# Patient Record
Sex: Male | Born: 1940
Health system: Southern US, Community
[De-identification: ages and names within clinical notes are randomized; demographics above are authoritative.]

## PROBLEM LIST (undated history)

## (undated) DIAGNOSIS — K219 Gastro-esophageal reflux disease without esophagitis: Secondary | ICD-10-CM

## (undated) DIAGNOSIS — I10 Essential (primary) hypertension: Secondary | ICD-10-CM

## (undated) DIAGNOSIS — M1990 Unspecified osteoarthritis, unspecified site: Secondary | ICD-10-CM

## (undated) DIAGNOSIS — C61 Malignant neoplasm of prostate: Secondary | ICD-10-CM

## (undated) DIAGNOSIS — E785 Hyperlipidemia, unspecified: Secondary | ICD-10-CM

## (undated) DIAGNOSIS — G629 Polyneuropathy, unspecified: Secondary | ICD-10-CM

## (undated) HISTORY — PX: POLYPECTOMY: SHX149

## (undated) HISTORY — DX: Malignant neoplasm of prostate: C61

## (undated) HISTORY — DX: Unspecified osteoarthritis, unspecified site: M19.90

## (undated) HISTORY — PX: COLONOSCOPY: SHX174

## (undated) HISTORY — DX: Polyneuropathy, unspecified: G62.9

## (undated) HISTORY — DX: Essential (primary) hypertension: I10

## (undated) HISTORY — DX: Hyperlipidemia, unspecified: E78.5

## (undated) HISTORY — DX: Gastro-esophageal reflux disease without esophagitis: K21.9

---

## 1998-11-01 DIAGNOSIS — K760 Fatty (change of) liver, not elsewhere classified: Secondary | ICD-10-CM | POA: Insufficient documentation

## 1999-04-14 ENCOUNTER — Inpatient Hospital Stay (HOSPITAL_COMMUNITY): Admission: EM | Admit: 1999-04-14 | Discharge: 1999-05-01 | Payer: Self-pay | Admitting: Emergency Medicine

## 1999-04-14 ENCOUNTER — Encounter: Payer: Self-pay | Admitting: Infectious Diseases

## 1999-04-15 ENCOUNTER — Encounter: Payer: Self-pay | Admitting: Internal Medicine

## 1999-04-15 ENCOUNTER — Encounter: Payer: Self-pay | Admitting: Infectious Diseases

## 1999-04-16 ENCOUNTER — Encounter: Payer: Self-pay | Admitting: Critical Care Medicine

## 1999-04-16 ENCOUNTER — Encounter: Payer: Self-pay | Admitting: Pulmonary Disease

## 1999-04-17 ENCOUNTER — Encounter: Payer: Self-pay | Admitting: Pulmonary Disease

## 1999-04-17 ENCOUNTER — Encounter: Payer: Self-pay | Admitting: Critical Care Medicine

## 1999-04-18 ENCOUNTER — Encounter: Payer: Self-pay | Admitting: Pulmonary Disease

## 1999-04-20 ENCOUNTER — Encounter: Payer: Self-pay | Admitting: Pulmonary Disease

## 1999-04-21 ENCOUNTER — Encounter (INDEPENDENT_AMBULATORY_CARE_PROVIDER_SITE_OTHER): Payer: Self-pay

## 1999-04-23 ENCOUNTER — Encounter: Payer: Self-pay | Admitting: Infectious Diseases

## 1999-04-25 ENCOUNTER — Encounter: Payer: Self-pay | Admitting: Critical Care Medicine

## 1999-04-26 ENCOUNTER — Encounter: Payer: Self-pay | Admitting: Critical Care Medicine

## 1999-04-28 ENCOUNTER — Encounter: Payer: Self-pay | Admitting: Pulmonary Disease

## 2005-03-11 ENCOUNTER — Encounter: Admission: RE | Admit: 2005-03-11 | Discharge: 2005-03-11 | Payer: Self-pay | Admitting: Internal Medicine

## 2005-06-22 ENCOUNTER — Ambulatory Visit (HOSPITAL_COMMUNITY): Admission: RE | Admit: 2005-06-22 | Discharge: 2005-06-22 | Payer: Self-pay | Admitting: *Deleted

## 2005-06-22 ENCOUNTER — Encounter (INDEPENDENT_AMBULATORY_CARE_PROVIDER_SITE_OTHER): Payer: Self-pay | Admitting: *Deleted

## 2006-06-14 ENCOUNTER — Encounter: Admission: RE | Admit: 2006-06-14 | Discharge: 2006-06-14 | Payer: Self-pay | Admitting: Internal Medicine

## 2010-02-05 ENCOUNTER — Encounter: Admission: RE | Admit: 2010-02-05 | Discharge: 2010-02-05 | Payer: Self-pay | Admitting: Internal Medicine

## 2010-11-01 DIAGNOSIS — M069 Rheumatoid arthritis, unspecified: Secondary | ICD-10-CM | POA: Insufficient documentation

## 2011-03-19 NOTE — Op Note (Signed)
NAMEADVAY, VOLANTE               ACCOUNT NO.:  [id number]   MEDICAL RECORD NO.:  [id number]          PATIENT TYPE:  AMB   LOCATION:  ENDO                         FACILITY:  [facility]   PHYSICIAN:  Georgiana Spinner, M.D.    DATE OF BIRTH:  1940-11-02   DATE OF PROCEDURE:  06/22/2005  DATE OF DISCHARGE:                                 OPERATIVE REPORT   PROCEDURE:  Colonoscopy.   INDICATIONS:  Colon polyps.   ANESTHESIA:  Demerol 20 mg, Versed 2 mg.   PROCEDURE:  With the patient mildly sedated in the left lateral decubitus  position, the Olympus videoscopic colonoscope was inserted in the rectum  after a rectal exam was performed and was unremarkable.  Subsequently the  colonoscope was advanced under direct vision to the cecum, identified by  ileocecal valve and appendiceal orifice, both which photographed.  In the  cecum was a small polyp, which was ablated with the hot biopsy forceps.  __________  a small polyp with snare cautery technique , both with the same  setting of 20/200 blended current, placed in the same bottle labeled,  cecum.  From this point colonoscope was then slowly withdrawn taking  circumferential views of colonic mucosa, stopping in the rectum, which  appeared normal on direct and showed hemorrhoids on retroflexed view.  The  endoscope straightened and withdrawn.  The patient's vital signs and pulse  oximetry were stable.  The patient tolerated the procedure well without  apparent complications.   FINDINGS:  Polyps of the cecum, internal hemorrhoids, otherwise unremarkable  exam.  __________ .           ______________________________  Georgiana Spinner, M.D.     GMO/MEDQ  D:  06/22/2005  T:  06/22/2005  Job:  [id number]

## 2011-03-19 NOTE — Op Note (Signed)
[name], Lucas Davis               ACCOUNT NO.:  [id number]   MEDICAL RECORD NO.:  [id number]          PATIENT TYPE:  AMB   LOCATION:  ENDO                         FACILITY:  [facility]   PHYSICIAN:  Georgiana Spinner, M.D.    DATE OF BIRTH:  06-18-1941   DATE OF PROCEDURE:  06/22/2005  DATE OF DISCHARGE:                                 OPERATIVE REPORT   PROCEDURE:  Upper endoscopy.   INDICATIONS:  GERD.   ANESTHESIA:  Demerol 50, Versed 5 milligrams.   PROCEDURE:  With the patient mildly sedated in the left lateral decubitus  position, the Olympus videoscopic endoscope was inserted in the mouth and  passed under direct vision through the esophagus which appeared normal until  we reached distal esophagus and there appeared to be an early stricture and  an area that was quite inflamed, erythematous and this was photographed and  biopsied. We then entered into the stomach.  Fundus, body appeared normal.  Antrum was a polyp that was photographed, biopsied. Duodenal bulb, second  portion duodenum were viewed and they appeared normal.  From this point, the  endoscope was slowly withdrawn taking circumferential views of duodenal  mucosa until the endoscope had been pulled back in the stomach placed in  retroflexion to view the stomach from below.  The endoscope was then  straightened, withdrawn taking circumferential views remaining gastric and  esophageal mucosa. The patient's vital signs and pulse oximeter remained  stable. The patient tolerated procedure well without apparent complications.   FINDINGS:  1.  Polyp of the gastric antrum and body.  2.  Erythematous change of the distal esophagus biopsied. Await biopsy      reports. The patient will call me for results and follow-up with me as      an outpatient.  Proceed to colonoscopy as planned.           ______________________________  Georgiana Spinner, M.D.     GMO/MEDQ  D:  06/22/2005  T:  06/22/2005  Job:  [id number]

## 2011-03-22 ENCOUNTER — Ambulatory Visit (AMBULATORY_SURGERY_CENTER): Payer: Medicare PPO | Admitting: *Deleted

## 2011-03-22 ENCOUNTER — Telehealth: Payer: Self-pay | Admitting: *Deleted

## 2011-03-22 ENCOUNTER — Encounter: Payer: Self-pay | Admitting: Gastroenterology

## 2011-03-22 VITALS — Ht 67.0 in | Wt 201.8 lb

## 2011-03-22 DIAGNOSIS — Z86010 Personal history of colonic polyps: Secondary | ICD-10-CM

## 2011-03-22 MED ORDER — PEG-KCL-NACL-NASULF-NA ASC-C 100 G PO SOLR: 1.0000 | Freq: Once | ORAL | Status: AC

## 2011-03-22 NOTE — Progress Notes (Signed)
Pt states he had colonoscopy 5 years ago at [facility]; unsure which MD he saw.  Release of information form signed and given to Chales Abrahams CMA.  Colonoscopy scheduled for 04-05-11 at 1:30 p.m.  Records received and Dr Beryle Lathe the pt to have direct colon. Chales Abrahams CMA (AAMA)

## 2011-03-22 NOTE — Telephone Encounter (Signed)
Pt states he had colonoscopy 5 years ago at [facility]; unsure which MD he saw.  Release of information formed and signed and given to Chales Abrahams, CMA.  Colonoscopy scheduled for 04-05-11

## 2011-03-23 NOTE — Telephone Encounter (Signed)
Records received and put on Dr Christella Hartigan desk

## 2011-04-02 ENCOUNTER — Other Ambulatory Visit: Payer: Self-pay | Admitting: Gastroenterology

## 2011-04-05 ENCOUNTER — Encounter: Payer: Self-pay | Admitting: Gastroenterology

## 2011-04-05 ENCOUNTER — Ambulatory Visit (AMBULATORY_SURGERY_CENTER): Payer: Medicare PPO | Admitting: Gastroenterology

## 2011-04-05 VITALS — BP 148/83 | HR 53 | Temp 98.2°F | Resp 16 | Ht 67.0 in | Wt 200.0 lb

## 2011-04-05 DIAGNOSIS — Z860100 Personal history of colon polyps, unspecified: Secondary | ICD-10-CM

## 2011-04-05 DIAGNOSIS — D126 Benign neoplasm of colon, unspecified: Secondary | ICD-10-CM

## 2011-04-05 DIAGNOSIS — K5730 Diverticulosis of large intestine without perforation or abscess without bleeding: Secondary | ICD-10-CM

## 2011-04-05 DIAGNOSIS — Z1211 Encounter for screening for malignant neoplasm of colon: Secondary | ICD-10-CM

## 2011-04-05 MED ORDER — SODIUM CHLORIDE 0.9 % IV SOLN: 500.0000 mL | INTRAVENOUS | Status: DC

## 2011-04-05 NOTE — Patient Instructions (Signed)
Follow discharge instructions.  Continue your medications.  Await pathology results. 

## 2012-06-08 ENCOUNTER — Other Ambulatory Visit: Payer: Self-pay | Admitting: Ophthalmology

## 2015-11-06 DIAGNOSIS — R972 Elevated prostate specific antigen [PSA]: Secondary | ICD-10-CM | POA: Diagnosis not present

## 2015-11-06 DIAGNOSIS — C61 Malignant neoplasm of prostate: Secondary | ICD-10-CM | POA: Diagnosis not present

## 2016-03-02 ENCOUNTER — Encounter: Payer: Self-pay | Admitting: Gastroenterology

## 2016-04-02 DIAGNOSIS — I10 Essential (primary) hypertension: Secondary | ICD-10-CM | POA: Diagnosis not present

## 2016-04-02 DIAGNOSIS — R739 Hyperglycemia, unspecified: Secondary | ICD-10-CM | POA: Diagnosis not present

## 2016-06-28 DIAGNOSIS — H9313 Tinnitus, bilateral: Secondary | ICD-10-CM | POA: Diagnosis not present

## 2016-06-28 DIAGNOSIS — H903 Sensorineural hearing loss, bilateral: Secondary | ICD-10-CM | POA: Diagnosis not present

## 2016-09-07 DIAGNOSIS — H903 Sensorineural hearing loss, bilateral: Secondary | ICD-10-CM | POA: Insufficient documentation

## 2016-09-07 DIAGNOSIS — H6123 Impacted cerumen, bilateral: Secondary | ICD-10-CM | POA: Diagnosis not present

## 2016-09-07 HISTORY — DX: Impacted cerumen, bilateral: H61.23

## 2016-09-16 DIAGNOSIS — I10 Essential (primary) hypertension: Secondary | ICD-10-CM | POA: Diagnosis not present

## 2016-09-16 DIAGNOSIS — E7800 Pure hypercholesterolemia, unspecified: Secondary | ICD-10-CM | POA: Diagnosis not present

## 2016-09-16 DIAGNOSIS — Z131 Encounter for screening for diabetes mellitus: Secondary | ICD-10-CM | POA: Diagnosis not present

## 2016-09-29 DIAGNOSIS — Z23 Encounter for immunization: Secondary | ICD-10-CM | POA: Diagnosis not present

## 2016-09-29 DIAGNOSIS — Z125 Encounter for screening for malignant neoplasm of prostate: Secondary | ICD-10-CM | POA: Diagnosis not present

## 2016-09-29 DIAGNOSIS — E7800 Pure hypercholesterolemia, unspecified: Secondary | ICD-10-CM | POA: Diagnosis not present

## 2016-09-29 DIAGNOSIS — I10 Essential (primary) hypertension: Secondary | ICD-10-CM | POA: Diagnosis not present

## 2016-11-04 DIAGNOSIS — C61 Malignant neoplasm of prostate: Secondary | ICD-10-CM | POA: Diagnosis not present

## 2016-11-09 DIAGNOSIS — C61 Malignant neoplasm of prostate: Secondary | ICD-10-CM | POA: Diagnosis not present

## 2017-02-04 ENCOUNTER — Other Ambulatory Visit: Payer: Self-pay | Admitting: Urology

## 2017-02-04 DIAGNOSIS — C61 Malignant neoplasm of prostate: Secondary | ICD-10-CM

## 2017-02-25 ENCOUNTER — Ambulatory Visit (HOSPITAL_COMMUNITY)
Admission: RE | Admit: 2017-02-25 | Discharge: 2017-02-25 | Disposition: A | Discharge status: Home / Self Care | Payer: PPO | Source: Ambulatory Visit | Attending: Urology | Admitting: Urology

## 2017-02-25 DIAGNOSIS — K5730 Diverticulosis of large intestine without perforation or abscess without bleeding: Secondary | ICD-10-CM | POA: Diagnosis not present

## 2017-02-25 DIAGNOSIS — C61 Malignant neoplasm of prostate: Secondary | ICD-10-CM | POA: Insufficient documentation

## 2017-02-25 DIAGNOSIS — R972 Elevated prostate specific antigen [PSA]: Secondary | ICD-10-CM | POA: Diagnosis not present

## 2017-02-25 LAB — POCT I-STAT CREATININE: CREATININE: 0.8 mg/dL (ref 0.61–1.24)

## 2017-02-25 MED ORDER — LIDOCAINE HCL 2 % EX GEL: 1.0000 "application " | Freq: Once | CUTANEOUS | Status: DC

## 2017-02-25 MED ORDER — LIDOCAINE HCL 2 % EX GEL
CUTANEOUS | Status: AC
  Filled 2017-02-25: qty 30

## 2017-02-25 MED ORDER — GADOBENATE DIMEGLUMINE 529 MG/ML IV SOLN
20.0000 mL | Freq: Once | INTRAVENOUS | Status: AC | PRN
  Administered 2017-02-25: 19 mL via INTRAVENOUS

## 2017-04-07 DIAGNOSIS — Z125 Encounter for screening for malignant neoplasm of prostate: Secondary | ICD-10-CM | POA: Diagnosis not present

## 2017-04-07 DIAGNOSIS — I10 Essential (primary) hypertension: Secondary | ICD-10-CM | POA: Diagnosis not present

## 2017-04-14 DIAGNOSIS — Z23 Encounter for immunization: Secondary | ICD-10-CM | POA: Diagnosis not present

## 2017-04-14 DIAGNOSIS — Z0000 Encounter for general adult medical examination without abnormal findings: Secondary | ICD-10-CM | POA: Diagnosis not present

## 2017-04-14 DIAGNOSIS — I10 Essential (primary) hypertension: Secondary | ICD-10-CM | POA: Diagnosis not present

## 2017-04-14 DIAGNOSIS — R251 Tremor, unspecified: Secondary | ICD-10-CM | POA: Diagnosis not present

## 2017-06-07 ENCOUNTER — Encounter: Payer: Self-pay | Admitting: Gastroenterology

## 2017-07-18 DIAGNOSIS — E7800 Pure hypercholesterolemia, unspecified: Secondary | ICD-10-CM | POA: Diagnosis not present

## 2017-07-18 DIAGNOSIS — R972 Elevated prostate specific antigen [PSA]: Secondary | ICD-10-CM | POA: Diagnosis not present

## 2017-07-18 DIAGNOSIS — Z0000 Encounter for general adult medical examination without abnormal findings: Secondary | ICD-10-CM | POA: Diagnosis not present

## 2017-07-18 DIAGNOSIS — I10 Essential (primary) hypertension: Secondary | ICD-10-CM | POA: Diagnosis not present

## 2017-07-18 DIAGNOSIS — Z23 Encounter for immunization: Secondary | ICD-10-CM | POA: Diagnosis not present

## 2017-07-25 ENCOUNTER — Ambulatory Visit (AMBULATORY_SURGERY_CENTER): Payer: Self-pay | Admitting: *Deleted

## 2017-07-25 VITALS — Ht 68.0 in | Wt 189.0 lb

## 2017-07-25 DIAGNOSIS — Z86010 Personal history of colonic polyps: Secondary | ICD-10-CM

## 2017-07-25 MED ORDER — NA SULFATE-K SULFATE-MG SULF 17.5-3.13-1.6 GM/177ML PO SOLN: ORAL | 0 refills | Status: DC

## 2017-07-25 NOTE — Progress Notes (Signed)
Patient denies any allergies to eggs or soy. Patient denies any problems with anesthesia/sedation. Patient denies any oxygen use at home and does not take any diet/weight loss medications. EMMI education assisgned to patient on colonoscopy, this was explained and instructions given to patient. $50 coupon for Suprep given to pt.

## 2017-07-27 ENCOUNTER — Encounter: Payer: Self-pay | Admitting: Gastroenterology

## 2017-08-08 ENCOUNTER — Ambulatory Visit (AMBULATORY_SURGERY_CENTER): Payer: PPO | Admitting: Gastroenterology

## 2017-08-08 ENCOUNTER — Encounter: Payer: Self-pay | Admitting: Gastroenterology

## 2017-08-08 VITALS — BP 143/73 | HR 48 | Temp 97.0°F | Resp 25 | Ht 68.0 in | Wt 189.0 lb

## 2017-08-08 DIAGNOSIS — D124 Benign neoplasm of descending colon: Secondary | ICD-10-CM | POA: Diagnosis not present

## 2017-08-08 DIAGNOSIS — Z86010 Personal history of colonic polyps: Secondary | ICD-10-CM

## 2017-08-08 DIAGNOSIS — E669 Obesity, unspecified: Secondary | ICD-10-CM | POA: Diagnosis not present

## 2017-08-08 DIAGNOSIS — K219 Gastro-esophageal reflux disease without esophagitis: Secondary | ICD-10-CM | POA: Diagnosis not present

## 2017-08-08 DIAGNOSIS — I10 Essential (primary) hypertension: Secondary | ICD-10-CM | POA: Diagnosis not present

## 2017-08-08 MED ORDER — SODIUM CHLORIDE 0.9 % IV SOLN: 500.0000 mL | INTRAVENOUS | Status: DC

## 2017-08-08 NOTE — Progress Notes (Signed)
Called to room to assist during endoscopic procedure.  Patient ID and intended procedure confirmed with present staff. Received instructions for my participation in the procedure from the performing physician.  

## 2017-08-08 NOTE — Patient Instructions (Signed)
**   Handouts given on colon polyps, and diverticulosis **   YOU HAD AN ENDOSCOPIC PROCEDURE TODAY AT [place]:   Refer to the procedure report that was given to you for any specific questions about what was found during the examination.  If the procedure report does not answer your questions, please call your gastroenterologist to clarify.  If you requested that your care partner not be given the details of your procedure findings, then the procedure report has been included in a sealed envelope for you to review at your convenience later.  YOU SHOULD EXPECT: Some feelings of bloating in the abdomen. Passage of more gas than usual.  Walking can help get rid of the air that was put into your GI tract during the procedure and reduce the bloating. If you had a lower endoscopy (such as a colonoscopy or flexible sigmoidoscopy) you may notice spotting of blood in your stool or on the toilet paper. If you underwent a bowel prep for your procedure, you may not have a normal bowel movement for a few days.  Please Note:  You might notice some irritation and congestion in your nose or some drainage.  This is from the oxygen used during your procedure.  There is no need for concern and it should clear up in a day or so.  SYMPTOMS TO REPORT IMMEDIATELY:   Following lower endoscopy (colonoscopy or flexible sigmoidoscopy):  Excessive amounts of blood in the stool  Significant tenderness or worsening of abdominal pains  Swelling of the abdomen that is new, acute  Fever of 100F or higher  For urgent or emergent issues, a gastroenterologist can be reached at any hour by calling [phone].   DIET:  We do recommend a small meal at first, but then you may proceed to your regular diet.  Drink plenty of fluids but you should avoid alcoholic beverages for 24 hours.  ACTIVITY:  You should plan to take it easy for the rest of today and you should NOT DRIVE or use heavy machinery until tomorrow  (because of the sedation medicines used during the test).    FOLLOW UP: Our staff will call the number listed on your records the next business day following your procedure to check on you and address any questions or concerns that you may have regarding the information given to you following your procedure. If we do not reach you, we will leave a message.  However, if you are feeling well and you are not experiencing any problems, there is no need to return our call.  We will assume that you have returned to your regular daily activities without incident.  If any biopsies were taken you will be contacted by phone or by letter within the next 1-3 weeks.  Please call us at [phone] if you have not heard about the biopsies in 3 weeks.    SIGNATURES/CONFIDENTIALITY: You and/or your care partner have signed paperwork which will be entered into your electronic medical record.  These signatures attest to the fact that that the information above on your After Visit Summary has been reviewed and is understood.  Full responsibility of the confidentiality of this discharge information lies with you and/or your care-partner.

## 2017-08-08 NOTE — Op Note (Signed)
[place] Patient Name: Lucas Davis Procedure Date: 08/08/2017 7:47 AM MRN: [id number] Endoscopist: Milus Banister , MD Age: 76 Referring MD:  Date of Birth: 23-Oct-1941 Gender: Male Account #: [id number] Procedure:                Colonoscopy Indications:              High risk colon cancer surveillance: Personal                            history of colonic polyps Medicines:                Monitored Anesthesia Care Procedure:                Pre-Anesthesia Assessment:                           - Prior to the procedure, a History and Physical                            was performed, and patient medications and                            allergies were reviewed. The patient's tolerance of                            previous anesthesia was also reviewed. The risks                            and benefits of the procedure and the sedation                            options and risks were discussed with the patient.                            All questions were answered, and informed consent                            was obtained. Prior Anticoagulants: The patient has                            taken no previous anticoagulant or antiplatelet                            agents. ASA Grade Assessment: II - A patient with                            mild systemic disease. After reviewing the risks                            and benefits, the patient was deemed in                            satisfactory condition to undergo the procedure.  After obtaining informed consent, the colonoscope                            was passed under direct vision. Throughout the                            procedure, the patient's blood pressure, pulse, and                            oxygen saturations were monitored continuously. The                            Colonoscope was introduced through the anus and                            advanced to the the cecum, identified by                             appendiceal orifice and ileocecal valve. The                            colonoscopy was performed without difficulty. The                            patient tolerated the procedure well. The quality                            of the bowel preparation was good. The ileocecal                            valve, appendiceal orifice, and rectum were                            photographed. Scope In: 7:58:42 AM Scope Out: 8:14:16 AM Scope Withdrawal Time: 0 hours 12 minutes 50 seconds  Total Procedure Duration: 0 hours 15 minutes 34 seconds  Findings:                 A 4 mm polyp was found in the descending colon. The                            polyp was sessile. The polyp was removed with a                            cold snare. Resection and retrieval were complete.                           Multiple small and large-mouthed diverticula were                            found in the left colon.                           The exam was otherwise without abnormality on  direct and retroflexion views. Complications:            No immediate complications. Estimated blood loss:                            None. Estimated Blood Loss:     Estimated blood loss: none. Impression:               - One 4 mm polyp in the descending colon, removed                            with a cold snare. Resected and retrieved.                           - Diverticulosis in the left colon.                           - The examination was otherwise normal on direct                            and retroflexion views. Recommendation:           - Patient has a contact number available for                            emergencies. The signs and symptoms of potential                            delayed complications were discussed with the                            patient. Return to normal activities tomorrow.                            Written discharge instructions were provided  to the                            patient.                           - Resume previous diet.                           - Continue present medications.                           - Await pathology results. You may not need further                            colon cancer screening, pending the biopsy report                            here. Milus Banister, MD 08/08/2017 8:17:05 AM This report has been signed electronically.

## 2017-08-08 NOTE — Progress Notes (Signed)
Report to PACU, RN, vss, BBS= Clear.  

## 2017-08-08 NOTE — Progress Notes (Signed)
Pt's states no medical or surgical changes since previsit or office visit. 

## 2017-08-09 ENCOUNTER — Telehealth: Payer: Self-pay | Admitting: *Deleted

## 2017-08-09 NOTE — Telephone Encounter (Signed)
  Follow up Call-  Call back number 08/08/2017  Post procedure Call Back phone  # [phone] home   , mobile [phone]  Permission to leave phone message Yes  Some recent data might be hidden     Patient questions:  Do you have a fever, pain , or abdominal swelling? No. Pain Score  0 *  Have you tolerated food without any problems? Yes.    Have you been able to return to your normal activities? Yes.    Do you have any questions about your discharge instructions: Diet   No. Medications  No. Follow up visit  No.  Do you have questions or concerns about your Care? No.  Actions: * If pain score is 4 or above: No action needed, pain <4.

## 2017-08-11 ENCOUNTER — Encounter: Payer: Self-pay | Admitting: Gastroenterology

## 2017-09-13 DIAGNOSIS — C61 Malignant neoplasm of prostate: Secondary | ICD-10-CM | POA: Diagnosis not present

## 2018-04-17 DIAGNOSIS — I10 Essential (primary) hypertension: Secondary | ICD-10-CM | POA: Diagnosis not present

## 2018-04-17 DIAGNOSIS — Z0000 Encounter for general adult medical examination without abnormal findings: Secondary | ICD-10-CM | POA: Diagnosis not present

## 2018-04-17 DIAGNOSIS — R972 Elevated prostate specific antigen [PSA]: Secondary | ICD-10-CM | POA: Diagnosis not present

## 2018-04-25 DIAGNOSIS — I10 Essential (primary) hypertension: Secondary | ICD-10-CM | POA: Diagnosis not present

## 2018-04-25 DIAGNOSIS — Z0000 Encounter for general adult medical examination without abnormal findings: Secondary | ICD-10-CM | POA: Diagnosis not present

## 2018-04-25 DIAGNOSIS — R251 Tremor, unspecified: Secondary | ICD-10-CM | POA: Diagnosis not present

## 2018-09-01 DIAGNOSIS — R972 Elevated prostate specific antigen [PSA]: Secondary | ICD-10-CM | POA: Diagnosis not present

## 2018-09-12 DIAGNOSIS — C61 Malignant neoplasm of prostate: Secondary | ICD-10-CM | POA: Diagnosis not present

## 2018-09-12 DIAGNOSIS — R351 Nocturia: Secondary | ICD-10-CM | POA: Diagnosis not present

## 2018-10-19 DIAGNOSIS — E7800 Pure hypercholesterolemia, unspecified: Secondary | ICD-10-CM | POA: Diagnosis not present

## 2018-10-19 DIAGNOSIS — I10 Essential (primary) hypertension: Secondary | ICD-10-CM | POA: Diagnosis not present

## 2018-10-19 DIAGNOSIS — R972 Elevated prostate specific antigen [PSA]: Secondary | ICD-10-CM | POA: Diagnosis not present

## 2018-10-26 DIAGNOSIS — E782 Mixed hyperlipidemia: Secondary | ICD-10-CM | POA: Diagnosis not present

## 2018-10-26 DIAGNOSIS — R972 Elevated prostate specific antigen [PSA]: Secondary | ICD-10-CM | POA: Diagnosis not present

## 2018-10-26 DIAGNOSIS — M25562 Pain in left knee: Secondary | ICD-10-CM | POA: Diagnosis not present

## 2018-10-26 DIAGNOSIS — R251 Tremor, unspecified: Secondary | ICD-10-CM | POA: Diagnosis not present

## 2018-10-26 DIAGNOSIS — Z23 Encounter for immunization: Secondary | ICD-10-CM | POA: Diagnosis not present

## 2018-10-26 DIAGNOSIS — I10 Essential (primary) hypertension: Secondary | ICD-10-CM | POA: Diagnosis not present

## 2018-10-26 DIAGNOSIS — M25561 Pain in right knee: Secondary | ICD-10-CM | POA: Diagnosis not present

## 2018-10-26 DIAGNOSIS — G8929 Other chronic pain: Secondary | ICD-10-CM | POA: Diagnosis not present

## 2018-11-15 IMAGING — MR MR PROSTATE WO/W CM
23 of 56 series · 23 of 56 positions shown · IV contrast (yes)
Comparison: None.

CLINICAL DATA: Elevated PSA levels.

EXAM:
MR PROSTATE WITHOUT AND WITH CONTRAST
TECHNIQUE: Multiplanar multisequence MRI images were obtained of the pelvis
centered about the prostate. Pre and post contrast images were
obtained.
CONTRAST:  19mL MULTIHANCE GADOBENATE DIMEGLUMINE 529 MG/ML IV SOLN

[Series 3: bSSFP fat-sat · axial · 6.0mm · 0.86mm/px · 1 of 44 slices shown]
[im 1/44]
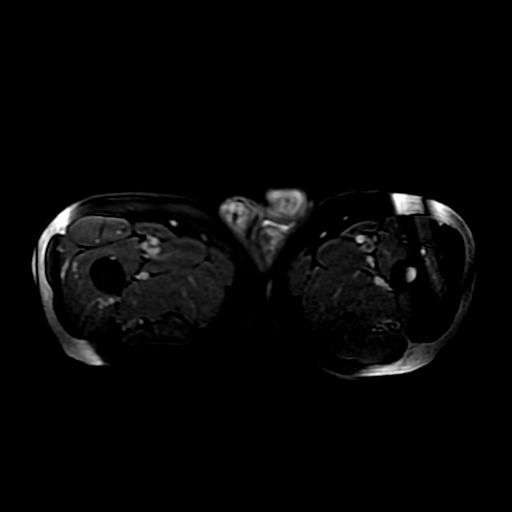

[Series 4: T1 · axial · 6.0mm · 0.86mm/px · 1 of 44 slices shown (1 of 2)]
[im 1/44]
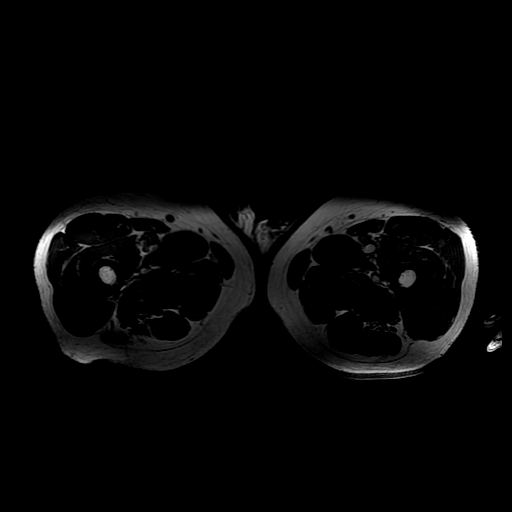

[Series 5: T2 · axial · 3.0mm · 0.29mm/px · 1 of 27 slices shown (1 of 4)]
[im 1/27]
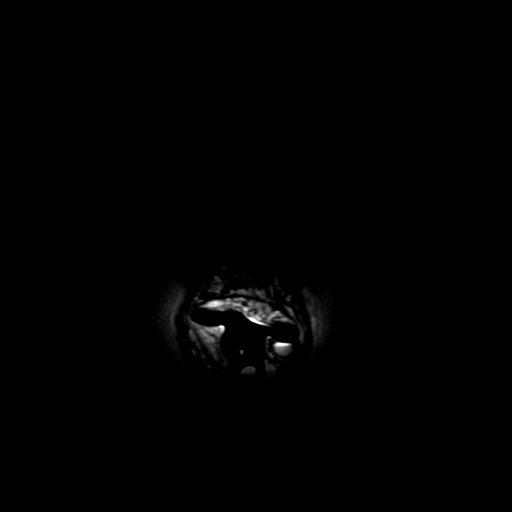

[Series 6: T1 · axial · 3.0mm · 0.29mm/px · 1 of 27 slices shown (2 of 2)]
[im 1/27]
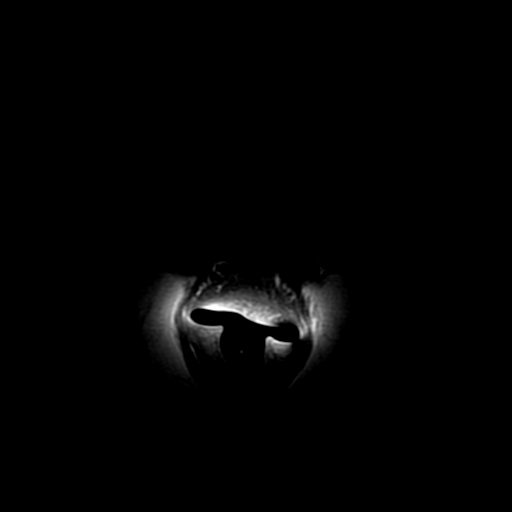

[Series 7: T2 · axial · 1.8mm · 0.47mm/px · 1 of 156 slices shown (2 of 4)]
[im 1/156]
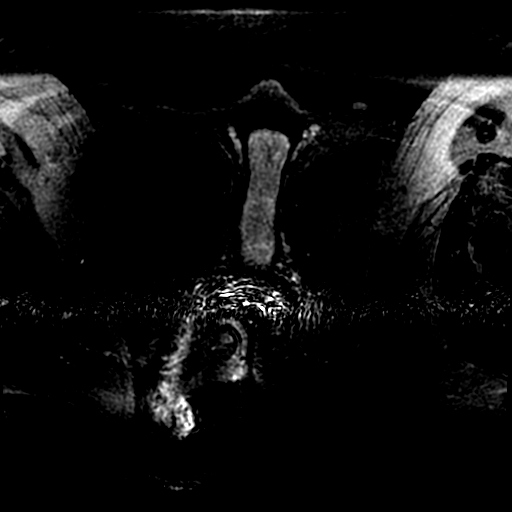

[Series 8: T2 · sagittal · 4.0mm · 0.29mm/px · 1 of 23 slices shown (3 of 4)]
[im 1/23]
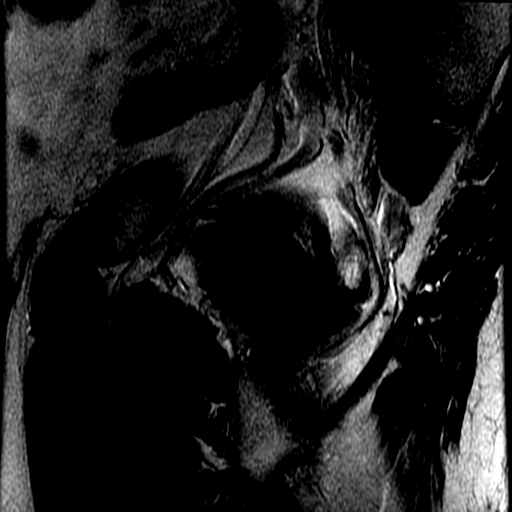

[Series 9: T2 · coronal · 4.0mm · 0.29mm/px · 1 of 21 slices shown (4 of 4)]
[im 1/21]
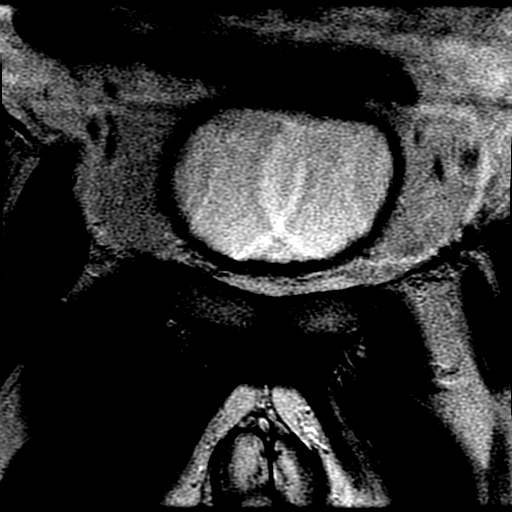

[Series 10: DWI · axial · 3.0mm · 0.59mm/px · 1 of 54 slices shown (1 of 6)]
[im 1/54]
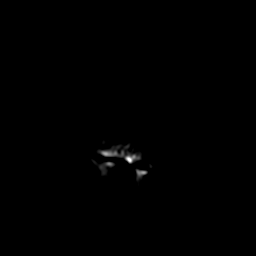

[Series 11: DWI · axial · 3.0mm · 0.59mm/px · 1 of 53 slices shown (2 of 6)]
[im 1/53]
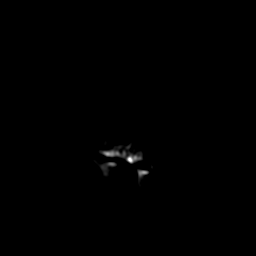

[Series 12: DWI · axial · 3.0mm · 0.59mm/px · 1 of 53 slices shown (3 of 6)]
[im 1/53]
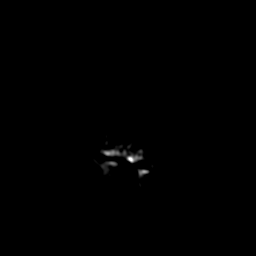

[Series 700: reformatted · axial · 1.8mm · 0.47mm/px · 1 of 95 slices shown (1 of 2)]
[im 1/95]
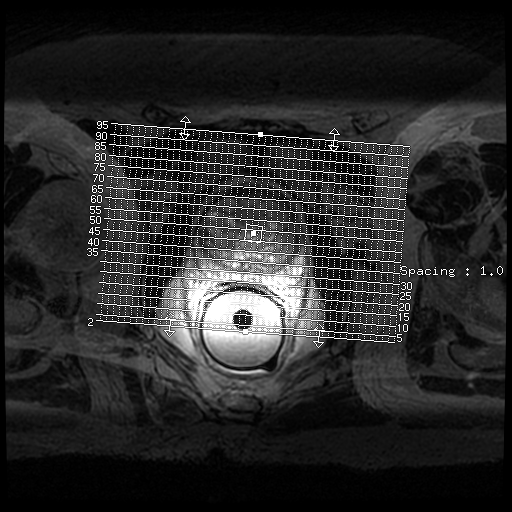

[Series 701: reformatted · axial · 1.8mm · 0.47mm/px · 1 of 64 slices shown (2 of 2)]
[im 1/64]
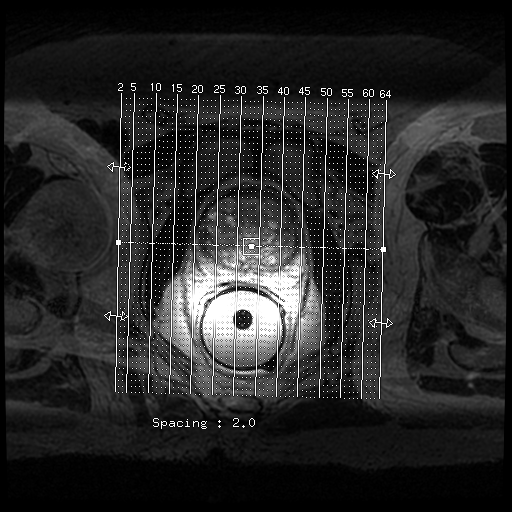

[Series 1000: DWI · axial · 3.0mm · 0.59mm/px · 1 of 27 slices shown (4 of 6)]
[im 1/27]
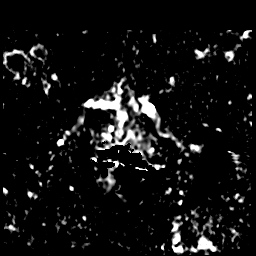

[Series 1100: DWI · axial · 3.0mm · 0.59mm/px · 1 of 27 slices shown (5 of 6)]
[im 1/27]
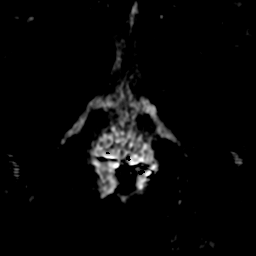

[Series 1200: DWI · axial · 3.0mm · 0.59mm/px · 1 of 27 slices shown (6 of 6)]
[im 1/27]
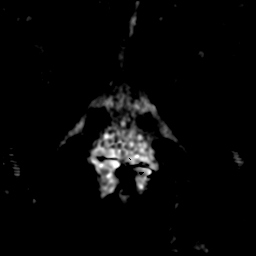

[((id)/(id)/1)-((id)/(id)/1) · axial · 3.0mm · 0.43mm/px · 1 of 64 slices shown (1 of 8)]
[im 1/64]
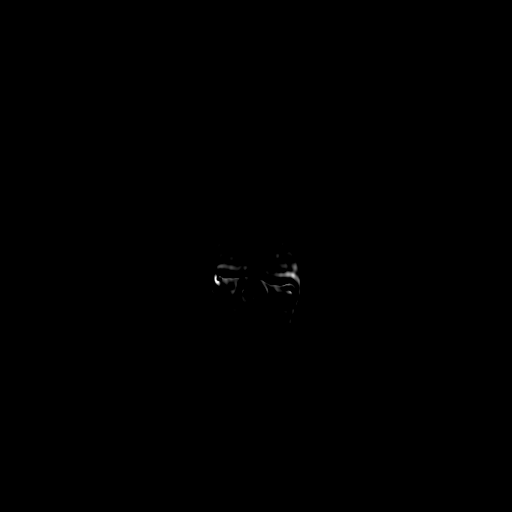

[((id)/(id)/1)-((id)/(id)/1) · axial · 3.0mm · 0.43mm/px · 1 of 64 slices shown (2 of 8)]
[im 1/64]
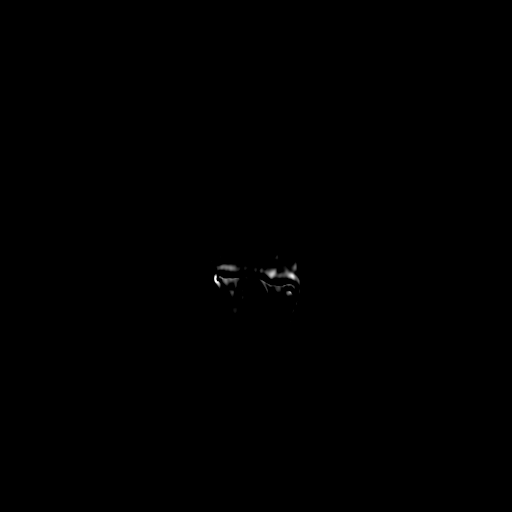

[((id)/(id)/1)-((id)/(id)/1) · axial · 3.0mm · 0.43mm/px · 1 of 62 slices shown (3 of 8)]
[im 1/62]
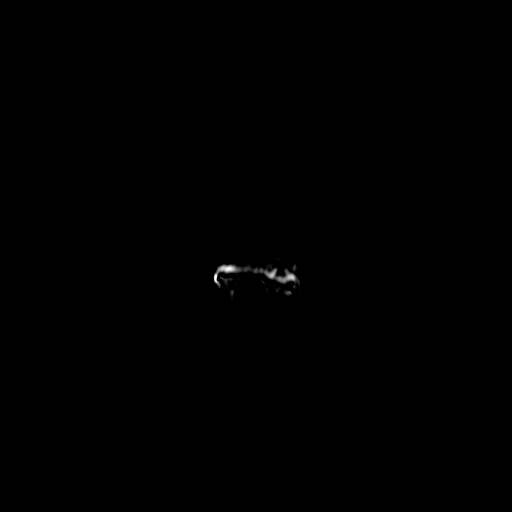

[((id)/(id)/1)-((id)/(id)/1) · axial · 3.0mm · 0.43mm/px · 1 of 64 slices shown (4 of 8)]
[im 1/64]
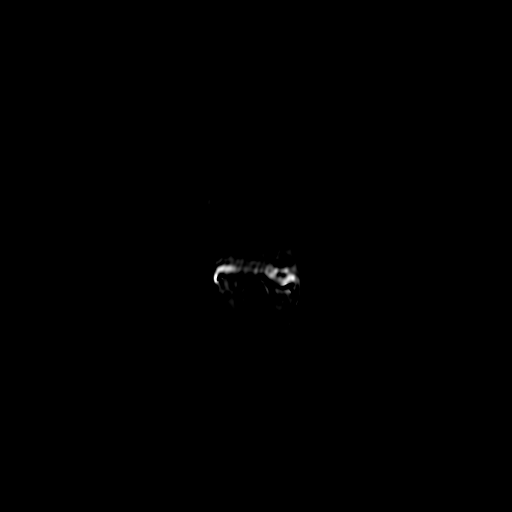

[((id)/(id)/1)-((id)/(id)/1) · axial · 3.0mm · 0.43mm/px · 1 of 64 slices shown (5 of 8)]
[im 1/64]
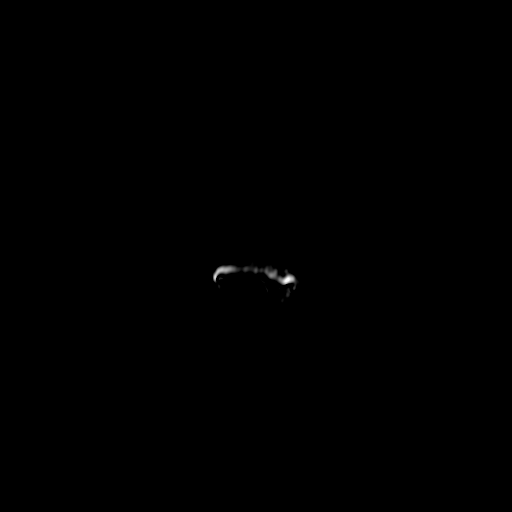

[((id)/(id)/1)-((id)/(id)/1) · axial · 3.0mm · 0.43mm/px · 1 of 64 slices shown (6 of 8)]
[im 1/64]
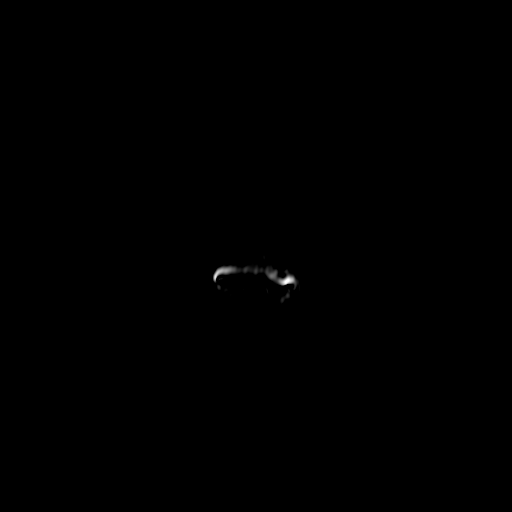

[((id)/(id)/1)-((id)/(id)/1) · axial · 3.0mm · 0.43mm/px · 1 of 63 slices shown (7 of 8)]
[im 1/63]
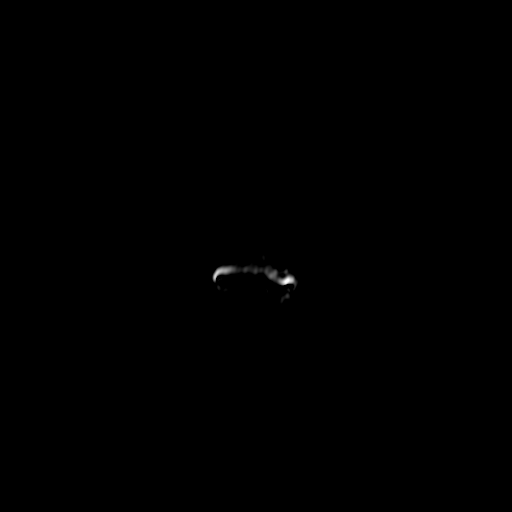

[((id)/(id)/1)-((id)/(id)/1) · axial · 3.0mm · 0.43mm/px · 1 of 64 slices shown (8 of 8)]
[im 1/64]
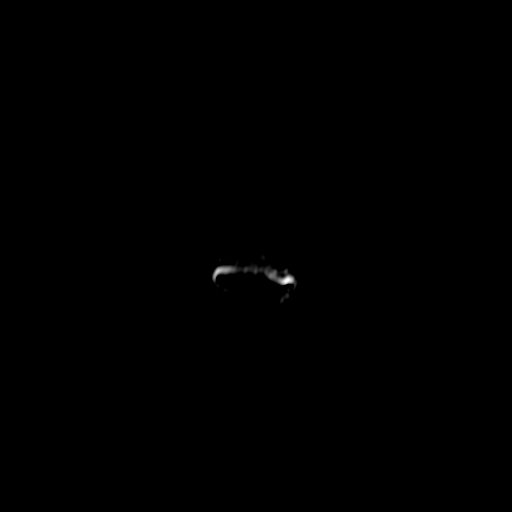

[23 of 56 positions shown; findings below may reference images not displayed]

FINDINGS: Prostate: Mild transition zone enlargement with multiple small BPH
nodules. No suspicious transition zone nodules identified.

The peripheral zone shows diffuse indistinct ADC and T2
hypointensity, however no focal ADC hypointense or DWI
hyperintensity peripheral zone nodules are identified.

Volume: 7.2 x 4.5 x 5.7 cm (volume = 97 cm^3)

Transcapsular spread:  Absent

Seminal vesicle involvement: Absent

Neurovascular bundle involvement: Absent

Pelvic adenopathy: Absent

Bone metastasis: Absent

Other findings: Sigmoid diverticulosis, without evidence of
diverticulitis.
IMPRESSION: No radiographic evidence of high-grade prostate carcinoma. PI-RADS
2: Low (clinically significant cancer is unlikely to be present)

## 2018-12-07 DIAGNOSIS — M25561 Pain in right knee: Secondary | ICD-10-CM | POA: Diagnosis not present

## 2018-12-07 DIAGNOSIS — M170 Bilateral primary osteoarthritis of knee: Secondary | ICD-10-CM | POA: Diagnosis not present

## 2018-12-07 DIAGNOSIS — M25562 Pain in left knee: Secondary | ICD-10-CM | POA: Diagnosis not present

## 2018-12-07 DIAGNOSIS — M1711 Unilateral primary osteoarthritis, right knee: Secondary | ICD-10-CM | POA: Diagnosis not present

## 2018-12-07 DIAGNOSIS — M1712 Unilateral primary osteoarthritis, left knee: Secondary | ICD-10-CM | POA: Diagnosis not present

## 2018-12-21 DIAGNOSIS — M25562 Pain in left knee: Secondary | ICD-10-CM | POA: Diagnosis not present

## 2019-03-13 DIAGNOSIS — C61 Malignant neoplasm of prostate: Secondary | ICD-10-CM | POA: Diagnosis not present

## 2019-04-17 DIAGNOSIS — M25562 Pain in left knee: Secondary | ICD-10-CM | POA: Diagnosis not present

## 2019-04-17 DIAGNOSIS — M170 Bilateral primary osteoarthritis of knee: Secondary | ICD-10-CM | POA: Diagnosis not present

## 2019-04-17 DIAGNOSIS — M1711 Unilateral primary osteoarthritis, right knee: Secondary | ICD-10-CM | POA: Diagnosis not present

## 2019-04-17 DIAGNOSIS — M1712 Unilateral primary osteoarthritis, left knee: Secondary | ICD-10-CM | POA: Diagnosis not present

## 2019-04-17 DIAGNOSIS — M25561 Pain in right knee: Secondary | ICD-10-CM | POA: Diagnosis not present

## 2019-05-14 DIAGNOSIS — Z125 Encounter for screening for malignant neoplasm of prostate: Secondary | ICD-10-CM | POA: Diagnosis not present

## 2019-05-14 DIAGNOSIS — I10 Essential (primary) hypertension: Secondary | ICD-10-CM | POA: Diagnosis not present

## 2019-05-14 DIAGNOSIS — E039 Hypothyroidism, unspecified: Secondary | ICD-10-CM | POA: Diagnosis not present

## 2019-05-14 DIAGNOSIS — E7800 Pure hypercholesterolemia, unspecified: Secondary | ICD-10-CM | POA: Diagnosis not present

## 2019-05-14 DIAGNOSIS — Z0000 Encounter for general adult medical examination without abnormal findings: Secondary | ICD-10-CM | POA: Diagnosis not present

## 2019-05-21 DIAGNOSIS — Z0000 Encounter for general adult medical examination without abnormal findings: Secondary | ICD-10-CM | POA: Diagnosis not present

## 2019-05-21 DIAGNOSIS — E039 Hypothyroidism, unspecified: Secondary | ICD-10-CM | POA: Diagnosis not present

## 2019-05-21 DIAGNOSIS — E782 Mixed hyperlipidemia: Secondary | ICD-10-CM | POA: Diagnosis not present

## 2019-05-21 DIAGNOSIS — R251 Tremor, unspecified: Secondary | ICD-10-CM | POA: Diagnosis not present

## 2019-05-21 DIAGNOSIS — I10 Essential (primary) hypertension: Secondary | ICD-10-CM | POA: Diagnosis not present

## 2019-05-21 DIAGNOSIS — C61 Malignant neoplasm of prostate: Secondary | ICD-10-CM | POA: Diagnosis not present

## 2019-05-21 DIAGNOSIS — M1990 Unspecified osteoarthritis, unspecified site: Secondary | ICD-10-CM | POA: Diagnosis not present

## 2019-05-21 DIAGNOSIS — R7301 Impaired fasting glucose: Secondary | ICD-10-CM | POA: Diagnosis not present

## 2019-05-21 DIAGNOSIS — K210 Gastro-esophageal reflux disease with esophagitis: Secondary | ICD-10-CM | POA: Diagnosis not present

## 2019-05-21 DIAGNOSIS — D126 Benign neoplasm of colon, unspecified: Secondary | ICD-10-CM | POA: Diagnosis not present

## 2019-05-28 DIAGNOSIS — Z13828 Encounter for screening for other musculoskeletal disorder: Secondary | ICD-10-CM | POA: Diagnosis not present

## 2019-05-28 DIAGNOSIS — M19041 Primary osteoarthritis, right hand: Secondary | ICD-10-CM | POA: Diagnosis not present

## 2019-05-28 DIAGNOSIS — K210 Gastro-esophageal reflux disease with esophagitis: Secondary | ICD-10-CM | POA: Diagnosis not present

## 2019-05-28 DIAGNOSIS — M79641 Pain in right hand: Secondary | ICD-10-CM | POA: Diagnosis not present

## 2019-05-28 DIAGNOSIS — M7989 Other specified soft tissue disorders: Secondary | ICD-10-CM | POA: Diagnosis not present

## 2019-05-28 DIAGNOSIS — M79671 Pain in right foot: Secondary | ICD-10-CM | POA: Diagnosis not present

## 2019-05-28 DIAGNOSIS — M79643 Pain in unspecified hand: Secondary | ICD-10-CM | POA: Diagnosis not present

## 2019-05-28 DIAGNOSIS — I10 Essential (primary) hypertension: Secondary | ICD-10-CM | POA: Diagnosis not present

## 2019-05-28 DIAGNOSIS — M25569 Pain in unspecified knee: Secondary | ICD-10-CM | POA: Diagnosis not present

## 2019-05-28 DIAGNOSIS — M1990 Unspecified osteoarthritis, unspecified site: Secondary | ICD-10-CM | POA: Diagnosis not present

## 2019-05-28 DIAGNOSIS — M79642 Pain in left hand: Secondary | ICD-10-CM | POA: Diagnosis not present

## 2019-05-28 DIAGNOSIS — M19042 Primary osteoarthritis, left hand: Secondary | ICD-10-CM | POA: Diagnosis not present

## 2019-05-28 DIAGNOSIS — Z79899 Other long term (current) drug therapy: Secondary | ICD-10-CM | POA: Diagnosis not present

## 2019-05-31 ENCOUNTER — Other Ambulatory Visit: Payer: Self-pay

## 2019-06-12 DIAGNOSIS — M7989 Other specified soft tissue disorders: Secondary | ICD-10-CM | POA: Diagnosis not present

## 2019-06-12 DIAGNOSIS — M1990 Unspecified osteoarthritis, unspecified site: Secondary | ICD-10-CM | POA: Diagnosis not present

## 2019-06-12 DIAGNOSIS — M25569 Pain in unspecified knee: Secondary | ICD-10-CM | POA: Diagnosis not present

## 2019-06-12 DIAGNOSIS — M79643 Pain in unspecified hand: Secondary | ICD-10-CM | POA: Diagnosis not present

## 2019-06-12 DIAGNOSIS — I10 Essential (primary) hypertension: Secondary | ICD-10-CM | POA: Diagnosis not present

## 2019-06-12 DIAGNOSIS — Z79899 Other long term (current) drug therapy: Secondary | ICD-10-CM | POA: Diagnosis not present

## 2019-07-13 DIAGNOSIS — I10 Essential (primary) hypertension: Secondary | ICD-10-CM | POA: Diagnosis not present

## 2019-07-13 DIAGNOSIS — Z23 Encounter for immunization: Secondary | ICD-10-CM | POA: Diagnosis not present

## 2019-07-13 DIAGNOSIS — M7989 Other specified soft tissue disorders: Secondary | ICD-10-CM | POA: Diagnosis not present

## 2019-07-13 DIAGNOSIS — Z79899 Other long term (current) drug therapy: Secondary | ICD-10-CM | POA: Diagnosis not present

## 2019-07-13 DIAGNOSIS — M79643 Pain in unspecified hand: Secondary | ICD-10-CM | POA: Diagnosis not present

## 2019-07-13 DIAGNOSIS — M25569 Pain in unspecified knee: Secondary | ICD-10-CM | POA: Diagnosis not present

## 2019-07-13 DIAGNOSIS — M1990 Unspecified osteoarthritis, unspecified site: Secondary | ICD-10-CM | POA: Diagnosis not present

## 2019-08-14 DIAGNOSIS — E782 Mixed hyperlipidemia: Secondary | ICD-10-CM | POA: Diagnosis not present

## 2019-08-14 DIAGNOSIS — E039 Hypothyroidism, unspecified: Secondary | ICD-10-CM | POA: Diagnosis not present

## 2019-08-14 DIAGNOSIS — R7301 Impaired fasting glucose: Secondary | ICD-10-CM | POA: Diagnosis not present

## 2019-08-21 DIAGNOSIS — M1990 Unspecified osteoarthritis, unspecified site: Secondary | ICD-10-CM | POA: Diagnosis not present

## 2019-08-21 DIAGNOSIS — M25569 Pain in unspecified knee: Secondary | ICD-10-CM | POA: Diagnosis not present

## 2019-08-21 DIAGNOSIS — M7989 Other specified soft tissue disorders: Secondary | ICD-10-CM | POA: Diagnosis not present

## 2019-08-21 DIAGNOSIS — M79643 Pain in unspecified hand: Secondary | ICD-10-CM | POA: Diagnosis not present

## 2019-08-21 DIAGNOSIS — Z79899 Other long term (current) drug therapy: Secondary | ICD-10-CM | POA: Diagnosis not present

## 2019-08-21 DIAGNOSIS — I10 Essential (primary) hypertension: Secondary | ICD-10-CM | POA: Diagnosis not present

## 2019-08-21 DIAGNOSIS — M8589 Other specified disorders of bone density and structure, multiple sites: Secondary | ICD-10-CM | POA: Diagnosis not present

## 2019-08-29 DIAGNOSIS — C61 Malignant neoplasm of prostate: Secondary | ICD-10-CM | POA: Diagnosis not present

## 2019-08-30 DIAGNOSIS — I10 Essential (primary) hypertension: Secondary | ICD-10-CM | POA: Diagnosis not present

## 2019-08-30 DIAGNOSIS — S06890A Other specified intracranial injury without loss of consciousness, initial encounter: Secondary | ICD-10-CM | POA: Diagnosis not present

## 2019-08-30 DIAGNOSIS — C61 Malignant neoplasm of prostate: Secondary | ICD-10-CM | POA: Diagnosis not present

## 2019-08-30 DIAGNOSIS — E7800 Pure hypercholesterolemia, unspecified: Secondary | ICD-10-CM | POA: Diagnosis not present

## 2019-08-30 DIAGNOSIS — F4310 Post-traumatic stress disorder, unspecified: Secondary | ICD-10-CM | POA: Diagnosis not present

## 2019-08-30 DIAGNOSIS — R7301 Impaired fasting glucose: Secondary | ICD-10-CM | POA: Diagnosis not present

## 2019-09-04 DIAGNOSIS — C61 Malignant neoplasm of prostate: Secondary | ICD-10-CM | POA: Diagnosis not present

## 2019-09-13 DIAGNOSIS — C61 Malignant neoplasm of prostate: Secondary | ICD-10-CM | POA: Diagnosis not present

## 2019-09-13 DIAGNOSIS — R7303 Prediabetes: Secondary | ICD-10-CM | POA: Diagnosis not present

## 2019-09-13 DIAGNOSIS — R251 Tremor, unspecified: Secondary | ICD-10-CM | POA: Diagnosis not present

## 2019-09-13 DIAGNOSIS — G629 Polyneuropathy, unspecified: Secondary | ICD-10-CM | POA: Diagnosis not present

## 2019-09-13 DIAGNOSIS — I10 Essential (primary) hypertension: Secondary | ICD-10-CM | POA: Diagnosis not present

## 2019-09-13 DIAGNOSIS — I739 Peripheral vascular disease, unspecified: Secondary | ICD-10-CM | POA: Diagnosis not present

## 2019-09-13 DIAGNOSIS — R079 Chest pain, unspecified: Secondary | ICD-10-CM | POA: Diagnosis not present

## 2019-10-05 DIAGNOSIS — M1990 Unspecified osteoarthritis, unspecified site: Secondary | ICD-10-CM | POA: Diagnosis not present

## 2019-10-05 DIAGNOSIS — M25569 Pain in unspecified knee: Secondary | ICD-10-CM | POA: Diagnosis not present

## 2019-10-05 DIAGNOSIS — I10 Essential (primary) hypertension: Secondary | ICD-10-CM | POA: Diagnosis not present

## 2019-10-05 DIAGNOSIS — M8580 Other specified disorders of bone density and structure, unspecified site: Secondary | ICD-10-CM | POA: Diagnosis not present

## 2019-10-05 DIAGNOSIS — M7989 Other specified soft tissue disorders: Secondary | ICD-10-CM | POA: Diagnosis not present

## 2019-10-05 DIAGNOSIS — Z79899 Other long term (current) drug therapy: Secondary | ICD-10-CM | POA: Diagnosis not present

## 2019-10-05 DIAGNOSIS — M79643 Pain in unspecified hand: Secondary | ICD-10-CM | POA: Diagnosis not present

## 2019-10-10 DIAGNOSIS — E785 Hyperlipidemia, unspecified: Secondary | ICD-10-CM | POA: Insufficient documentation

## 2019-10-10 DIAGNOSIS — I10 Essential (primary) hypertension: Secondary | ICD-10-CM | POA: Insufficient documentation

## 2019-10-10 DIAGNOSIS — Z7189 Other specified counseling: Secondary | ICD-10-CM | POA: Insufficient documentation

## 2019-10-10 NOTE — Progress Notes (Signed)
Cardiology Office Note   Date:  10/11/2019   ID:  Lucas Davis, Lucas Davis 1941/06/09, MRN [id number]  PCP:  Jani Gravel, MD  Cardiologist:   Minus Breeding, MD Referring:  Merrilee Seashore, MD   Chief Complaint  Patient presents with  . Chest Pain      History of Present Illness: Lucas Davis is a 79 y.o. male who is referred by Dr. Maudie Mercury  for evaluatoin of chest pain.  He has no past cardiac history.  He does have cardiovascular risk factors.  He says to 6 months he has been getting pain.  Under his left nipple under the costo chondral angle.  It is somewhat sharp discomfort.  It last for 3 to 4 minutes.  It happens at rest and goes away spontaneously.  He cannot bring it on with activities.  He walks his dog twice a day and does not bring it on.  It is mild.  He never had this before.  The pattern is stable.  He is not getting associated symptoms such as nausea vomiting or diaphoresis.  He has no new shortness of breath, PND or orthopnea.  (Palpitations, presyncope or syncope.  He has not had any prior cardiac history except for routine stress test many years ago when he was working.   Past Medical History:  Diagnosis Date  . Arthritis   . GERD (gastroesophageal reflux disease)   . Hyperlipidemia   . Hypertension   . Neuropathy   . Prostate cancer [facility])     Past Surgical History:  Procedure Laterality Date  . COLONOSCOPY  [id number]   hx colon polyps  . POLYPECTOMY       Current Outpatient Medications  Medication Sig Dispense Refill  . aspirin 81 MG tablet Take 81 mg by mouth daily.      . folic acid (FOLVITE) 1 MG tablet Take 1 mg by mouth daily.    Marland Kitchen glucosamine-chondroitin 500-400 MG tablet Take 1 tablet by mouth 3 (three) times daily.    Marland Kitchen losartan-hydrochlorothiazide (HYZAAR) 100-25 MG per tablet Take 1 tablet by mouth daily.      . methotrexate (RHEUMATREX) 2.5 MG tablet Take 15 mg by mouth once a week.    . multivitamin (THERAGRAN) per tablet Take 1 tablet  by mouth daily.      . Omega-3 Fatty Acids (FISH OIL PO) Take 2 tablets by mouth daily.      . Omeprazole (PRILOSEC PO) Take 1 tablet by mouth every other day.     . pravastatin (PRAVACHOL) 20 MG tablet Take 20 mg by mouth daily.      . predniSONE (DELTASONE) 5 MG tablet Take 5 mg by mouth daily.    . propranolol (INDERAL) 10 MG tablet Take 10 mg by mouth 2 (two) times daily.    . Pseudoephedrine-Guaifenesin (MUCINEX D PO) Take 1 tablet by mouth daily.       No current facility-administered medications for this visit.    Allergies:   Patient has no known allergies.    Social History:  The patient  reports that he has never smoked. He has never used smokeless tobacco. He reports current alcohol use. He reports that he does not use drugs.   Family History:  The patient's family history includes Heart disease (age of onset: 53) in his father.    ROS:  Please see the history of present illness.   Otherwise, review of systems are positive for none.   All other systems  are reviewed and negative.    PHYSICAL EXAM: VS:  BP (!) 146/83   Pulse (!) 51   Temp (!) 97.5 F (36.4 C)   Ht 5\' 8"  (1.727 m)   Wt 194 lb 9.6 oz (88.3 kg)   SpO2 98%   BMI 29.59 kg/m  , BMI Body mass index is 29.59 kg/m. GENERAL:  Well appearing HEENT:  Pupils equal round and reactive, fundi not visualized, oral mucosa unremarkable NECK:  No jugular venous distention, waveform within normal limits, carotid upstroke brisk and symmetric, no bruits, no thyromegaly LYMPHATICS:  No cervical, inguinal adenopathy LUNGS:  Clear to auscultation bilaterally BACK:  No CVA tenderness CHEST:  Unremarkable HEART:  PMI not displaced or sustained,S1 and S2 within normal limits, no S3, no S4, no clicks, no rubs, no murmurs ABD:  Flat, positive bowel sounds normal in frequency in pitch, no bruits, no rebound, no guarding, no midline pulsatile mass, no hepatomegaly, no splenomegaly EXT:  2 plus pulses throughout, no edema, no  cyanosis no clubbing SKIN:  No rashes no nodules NEURO:  Cranial nerves II through XII grossly intact, motor grossly intact throughout PSYCH:  Cognitively intact, oriented to person place and time    EKG:  EKG is ordered today. The ekg ordered today demonstrates NSR, rate 54, axis WNL, intervals WNL, no acute ST T wave changes.     Recent Labs: No results found for requested labs within last 8760 hours.    Lipid Panel No results found for: CHOL, TRIG, HDL, CHOLHDL, VLDL, LDLCALC, LDLDIRECT    Wt Readings from Last 3 Encounters:  10/11/19 194 lb 9.6 oz (88.3 kg)  08/08/17 189 lb (85.7 kg)  07/25/17 189 lb (85.7 kg)      Other studies Reviewed: Additional studies/ records that were reviewed today include: Office recrods and labs. Review of the above records demonstrates:  Please see elsewhere in the note.     ASSESSMENT AND PLAN:  CHEST PAIN:  I will bring the patient back for a POET (Plain Old Exercise Test). This will allow me to screen for obstructive coronary disease, risk stratify and very importantly provide a prescription for exercise.  HTN:   The blood pressure is very mildly elevated and we talked about goals of therapy.  I would not change his meds but would suggest some weight loss with exercise.  DYSLIPIDEMIA: His LDL was slightly elevated.  However, his triglycerides were mildly elevated for the LDL calculated, probably incorrect.  Again this should be managed with diet and exercise.  COVID EDUCATION:  We talked about the vaccine.    Current medicines are reviewed at length with the patient today.  The patient does not have concerns regarding medicines.  The following changes have been made:  no change  Labs/ tests ordered today include:   Orders Placed This Encounter  Procedures  . EXERCISE TOLERANCE TEST (ETT)  . EKG 12-Lead     Disposition:   FU with me as needed.      Signed, Minus Breeding, MD  10/11/2019 10:34 AM    [facility]

## 2019-10-11 ENCOUNTER — Ambulatory Visit: Payer: PPO | Admitting: Cardiology

## 2019-10-11 ENCOUNTER — Other Ambulatory Visit: Payer: Self-pay

## 2019-10-11 ENCOUNTER — Encounter: Payer: Self-pay | Admitting: Cardiology

## 2019-10-11 VITALS — BP 146/83 | HR 51 | Temp 97.5°F | Ht 68.0 in | Wt 194.6 lb

## 2019-10-11 DIAGNOSIS — E785 Hyperlipidemia, unspecified: Secondary | ICD-10-CM | POA: Diagnosis not present

## 2019-10-11 DIAGNOSIS — I10 Essential (primary) hypertension: Secondary | ICD-10-CM

## 2019-10-11 DIAGNOSIS — I209 Angina pectoris, unspecified: Secondary | ICD-10-CM | POA: Diagnosis not present

## 2019-10-11 DIAGNOSIS — Z7189 Other specified counseling: Secondary | ICD-10-CM

## 2019-10-11 NOTE — Patient Instructions (Addendum)
Medication Instructions:  Your physician recommends that you continue on your current medications as directed. Please refer to the Current Medication list given to you today.   *If you need a refill on your cardiac medications before your next appointment, please call your pharmacy*  Lab Work: none If you have labs (blood work) drawn today and your tests are completely normal, you will receive your results only by: Marland Kitchen MyChart Message (if you have MyChart) OR . A paper copy in the mail If you have any lab test that is abnormal or we need to change your treatment, we will call you to review the results.  Testing/Procedures: Your physician has requested that you have an exercise tolerance test. For further information please visit [email or web address]. Please also follow instruction sheet, as given. Please hold your propranolol evening dose the night before the test and morning dose the morning of the test. You will need a COVID-19 test 3-4 days prior to your exercise tolerance test.  Follow-Up: At [facility], you and your health needs are our priority.  As part of our continuing mission to provide you with exceptional heart care, we have created designated Provider Care Teams.  These Care Teams include your primary Cardiologist (physician) and Advanced Practice Providers (APPs -  Physician Assistants and Nurse Practitioners) who all work together to provide you with the care you need, when you need it.  Your next appointment:   AS NEEDED  The format for your next appointment:   Either In Person or Virtual  Provider:   Minus Breeding, MD

## 2019-11-05 ENCOUNTER — Other Ambulatory Visit (HOSPITAL_COMMUNITY): Payer: PPO

## 2019-11-06 ENCOUNTER — Encounter (HOSPITAL_COMMUNITY): Payer: PPO

## 2019-11-08 ENCOUNTER — Inpatient Hospital Stay (HOSPITAL_COMMUNITY): Admission: RE | Admit: 2019-11-08 | Payer: PPO | Source: Ambulatory Visit

## 2019-11-15 DIAGNOSIS — M1990 Unspecified osteoarthritis, unspecified site: Secondary | ICD-10-CM | POA: Diagnosis not present

## 2019-11-15 DIAGNOSIS — I10 Essential (primary) hypertension: Secondary | ICD-10-CM | POA: Diagnosis not present

## 2019-11-15 DIAGNOSIS — M170 Bilateral primary osteoarthritis of knee: Secondary | ICD-10-CM | POA: Diagnosis not present

## 2019-11-15 DIAGNOSIS — C61 Malignant neoplasm of prostate: Secondary | ICD-10-CM | POA: Diagnosis not present

## 2019-11-15 DIAGNOSIS — Z79899 Other long term (current) drug therapy: Secondary | ICD-10-CM | POA: Diagnosis not present

## 2019-11-15 DIAGNOSIS — M25569 Pain in unspecified knee: Secondary | ICD-10-CM | POA: Diagnosis not present

## 2019-11-15 DIAGNOSIS — R972 Elevated prostate specific antigen [PSA]: Secondary | ICD-10-CM | POA: Diagnosis not present

## 2019-11-15 DIAGNOSIS — M79643 Pain in unspecified hand: Secondary | ICD-10-CM | POA: Diagnosis not present

## 2019-11-15 DIAGNOSIS — L4050 Arthropathic psoriasis, unspecified: Secondary | ICD-10-CM | POA: Diagnosis not present

## 2019-11-15 DIAGNOSIS — M7989 Other specified soft tissue disorders: Secondary | ICD-10-CM | POA: Diagnosis not present

## 2019-11-15 DIAGNOSIS — M8580 Other specified disorders of bone density and structure, unspecified site: Secondary | ICD-10-CM | POA: Diagnosis not present

## 2019-11-23 ENCOUNTER — Ambulatory Visit: Payer: PPO | Attending: Internal Medicine

## 2019-11-23 DIAGNOSIS — Z23 Encounter for immunization: Secondary | ICD-10-CM | POA: Insufficient documentation

## 2019-11-23 NOTE — Progress Notes (Signed)
   Covid-19 Vaccination Clinic  Name:  Lucas Davis    MRN: [id number] DOB: Aug 22, 1941  11/23/2019  [name] was observed post Covid-19 immunization for 15 minutes without incidence. He was provided with Vaccine Information Sheet and instruction to access the V-Safe system.   [name] was instructed to call 911 with any severe reactions post vaccine: Marland Kitchen Difficulty breathing  . Swelling of your face and throat  . A fast heartbeat  . A bad rash all over your body  . Dizziness and weakness    Immunizations Administered    Name Date Dose VIS Date Route   Pfizer COVID-19 Vaccine 11/23/2019  1:29 PM 0.3 mL 10/12/2019 Intramuscular   Manufacturer: [place]   Lot: [id number]   [place]: [id number]

## 2019-11-29 DIAGNOSIS — M1990 Unspecified osteoarthritis, unspecified site: Secondary | ICD-10-CM | POA: Diagnosis not present

## 2019-12-15 ENCOUNTER — Ambulatory Visit: Payer: PPO | Attending: Internal Medicine

## 2019-12-15 DIAGNOSIS — Z23 Encounter for immunization: Secondary | ICD-10-CM | POA: Insufficient documentation

## 2019-12-15 NOTE — Progress Notes (Signed)
   Covid-19 Vaccination Clinic  Name:  Lucas Davis    MRN: [id number] DOB: 1941-03-27  12/15/2019  Lucas Davis was observed post Covid-19 immunization for 15 minutes without incidence. He was provided with Vaccine Information Sheet and instruction to access the V-Safe system.   Lucas Davis was instructed to call 911 with any severe reactions post vaccine: Marland Kitchen Difficulty breathing  . Swelling of your face and throat  . A fast heartbeat  . A bad rash all over your body  . Dizziness and weakness    Immunizations Administered    Name Date Dose VIS Date Route   Pfizer COVID-19 Vaccine 12/15/2019 12:49 PM 0.3 mL 10/12/2019 Intramuscular   Manufacturer: [place]   Lot: [id number]   [place]: [id number]

## 2020-01-03 DIAGNOSIS — R7303 Prediabetes: Secondary | ICD-10-CM | POA: Diagnosis not present

## 2020-01-03 DIAGNOSIS — I10 Essential (primary) hypertension: Secondary | ICD-10-CM | POA: Diagnosis not present

## 2020-01-17 DIAGNOSIS — L4050 Arthropathic psoriasis, unspecified: Secondary | ICD-10-CM | POA: Diagnosis not present

## 2020-01-17 DIAGNOSIS — M170 Bilateral primary osteoarthritis of knee: Secondary | ICD-10-CM | POA: Diagnosis not present

## 2020-01-17 DIAGNOSIS — M7989 Other specified soft tissue disorders: Secondary | ICD-10-CM | POA: Diagnosis not present

## 2020-01-17 DIAGNOSIS — C61 Malignant neoplasm of prostate: Secondary | ICD-10-CM | POA: Diagnosis not present

## 2020-01-17 DIAGNOSIS — R972 Elevated prostate specific antigen [PSA]: Secondary | ICD-10-CM | POA: Diagnosis not present

## 2020-01-17 DIAGNOSIS — M79643 Pain in unspecified hand: Secondary | ICD-10-CM | POA: Diagnosis not present

## 2020-01-17 DIAGNOSIS — M1990 Unspecified osteoarthritis, unspecified site: Secondary | ICD-10-CM | POA: Diagnosis not present

## 2020-01-17 DIAGNOSIS — Z79899 Other long term (current) drug therapy: Secondary | ICD-10-CM | POA: Diagnosis not present

## 2020-02-20 DIAGNOSIS — I10 Essential (primary) hypertension: Secondary | ICD-10-CM | POA: Diagnosis not present

## 2020-02-20 DIAGNOSIS — I739 Peripheral vascular disease, unspecified: Secondary | ICD-10-CM | POA: Diagnosis not present

## 2020-02-20 DIAGNOSIS — R251 Tremor, unspecified: Secondary | ICD-10-CM | POA: Diagnosis not present

## 2020-02-20 DIAGNOSIS — C61 Malignant neoplasm of prostate: Secondary | ICD-10-CM | POA: Diagnosis not present

## 2020-02-20 DIAGNOSIS — M170 Bilateral primary osteoarthritis of knee: Secondary | ICD-10-CM | POA: Diagnosis not present

## 2020-02-20 DIAGNOSIS — R7303 Prediabetes: Secondary | ICD-10-CM | POA: Diagnosis not present

## 2020-02-20 DIAGNOSIS — E039 Hypothyroidism, unspecified: Secondary | ICD-10-CM | POA: Diagnosis not present

## 2020-02-20 DIAGNOSIS — E782 Mixed hyperlipidemia: Secondary | ICD-10-CM | POA: Diagnosis not present

## 2020-02-20 DIAGNOSIS — L4050 Arthropathic psoriasis, unspecified: Secondary | ICD-10-CM | POA: Diagnosis not present

## 2020-02-20 DIAGNOSIS — F4310 Post-traumatic stress disorder, unspecified: Secondary | ICD-10-CM | POA: Diagnosis not present

## 2020-02-20 DIAGNOSIS — G629 Polyneuropathy, unspecified: Secondary | ICD-10-CM | POA: Diagnosis not present

## 2020-02-25 DIAGNOSIS — C61 Malignant neoplasm of prostate: Secondary | ICD-10-CM | POA: Diagnosis not present

## 2020-03-03 DIAGNOSIS — C61 Malignant neoplasm of prostate: Secondary | ICD-10-CM | POA: Diagnosis not present

## 2020-03-20 DIAGNOSIS — M170 Bilateral primary osteoarthritis of knee: Secondary | ICD-10-CM | POA: Diagnosis not present

## 2020-03-20 DIAGNOSIS — R972 Elevated prostate specific antigen [PSA]: Secondary | ICD-10-CM | POA: Diagnosis not present

## 2020-03-20 DIAGNOSIS — C61 Malignant neoplasm of prostate: Secondary | ICD-10-CM | POA: Diagnosis not present

## 2020-03-20 DIAGNOSIS — Z79899 Other long term (current) drug therapy: Secondary | ICD-10-CM | POA: Diagnosis not present

## 2020-03-20 DIAGNOSIS — M7989 Other specified soft tissue disorders: Secondary | ICD-10-CM | POA: Diagnosis not present

## 2020-03-20 DIAGNOSIS — L4050 Arthropathic psoriasis, unspecified: Secondary | ICD-10-CM | POA: Diagnosis not present

## 2020-03-20 DIAGNOSIS — M79643 Pain in unspecified hand: Secondary | ICD-10-CM | POA: Diagnosis not present

## 2020-03-20 DIAGNOSIS — M1990 Unspecified osteoarthritis, unspecified site: Secondary | ICD-10-CM | POA: Diagnosis not present

## 2020-06-20 DIAGNOSIS — M1712 Unilateral primary osteoarthritis, left knee: Secondary | ICD-10-CM | POA: Diagnosis not present

## 2020-06-20 DIAGNOSIS — M1711 Unilateral primary osteoarthritis, right knee: Secondary | ICD-10-CM | POA: Diagnosis not present

## 2020-06-20 DIAGNOSIS — M25562 Pain in left knee: Secondary | ICD-10-CM | POA: Diagnosis not present

## 2020-06-20 DIAGNOSIS — M25569 Pain in unspecified knee: Secondary | ICD-10-CM | POA: Diagnosis not present

## 2020-06-20 DIAGNOSIS — C61 Malignant neoplasm of prostate: Secondary | ICD-10-CM | POA: Diagnosis not present

## 2020-06-20 DIAGNOSIS — M1990 Unspecified osteoarthritis, unspecified site: Secondary | ICD-10-CM | POA: Diagnosis not present

## 2020-06-20 DIAGNOSIS — L4050 Arthropathic psoriasis, unspecified: Secondary | ICD-10-CM | POA: Diagnosis not present

## 2020-06-20 DIAGNOSIS — Z79899 Other long term (current) drug therapy: Secondary | ICD-10-CM | POA: Diagnosis not present

## 2020-06-20 DIAGNOSIS — M25561 Pain in right knee: Secondary | ICD-10-CM | POA: Diagnosis not present

## 2020-06-20 DIAGNOSIS — M170 Bilateral primary osteoarthritis of knee: Secondary | ICD-10-CM | POA: Diagnosis not present

## 2020-06-20 DIAGNOSIS — R972 Elevated prostate specific antigen [PSA]: Secondary | ICD-10-CM | POA: Diagnosis not present

## 2020-06-20 DIAGNOSIS — M7989 Other specified soft tissue disorders: Secondary | ICD-10-CM | POA: Diagnosis not present

## 2020-06-20 DIAGNOSIS — M79643 Pain in unspecified hand: Secondary | ICD-10-CM | POA: Diagnosis not present

## 2020-07-04 DIAGNOSIS — I10 Essential (primary) hypertension: Secondary | ICD-10-CM | POA: Diagnosis not present

## 2020-07-04 DIAGNOSIS — E7800 Pure hypercholesterolemia, unspecified: Secondary | ICD-10-CM | POA: Diagnosis not present

## 2020-07-04 DIAGNOSIS — R972 Elevated prostate specific antigen [PSA]: Secondary | ICD-10-CM | POA: Diagnosis not present

## 2020-07-04 DIAGNOSIS — R7301 Impaired fasting glucose: Secondary | ICD-10-CM | POA: Diagnosis not present

## 2020-07-23 DIAGNOSIS — R7303 Prediabetes: Secondary | ICD-10-CM | POA: Diagnosis not present

## 2020-07-23 DIAGNOSIS — E039 Hypothyroidism, unspecified: Secondary | ICD-10-CM | POA: Diagnosis not present

## 2020-07-23 DIAGNOSIS — I10 Essential (primary) hypertension: Secondary | ICD-10-CM | POA: Diagnosis not present

## 2020-07-23 DIAGNOSIS — Z0000 Encounter for general adult medical examination without abnormal findings: Secondary | ICD-10-CM | POA: Diagnosis not present

## 2020-07-23 DIAGNOSIS — M170 Bilateral primary osteoarthritis of knee: Secondary | ICD-10-CM | POA: Diagnosis not present

## 2020-07-23 DIAGNOSIS — E782 Mixed hyperlipidemia: Secondary | ICD-10-CM | POA: Diagnosis not present

## 2020-07-23 DIAGNOSIS — D126 Benign neoplasm of colon, unspecified: Secondary | ICD-10-CM | POA: Diagnosis not present

## 2020-07-23 DIAGNOSIS — R251 Tremor, unspecified: Secondary | ICD-10-CM | POA: Diagnosis not present

## 2020-07-23 DIAGNOSIS — F4310 Post-traumatic stress disorder, unspecified: Secondary | ICD-10-CM | POA: Diagnosis not present

## 2020-07-23 DIAGNOSIS — C61 Malignant neoplasm of prostate: Secondary | ICD-10-CM | POA: Diagnosis not present

## 2020-07-23 DIAGNOSIS — R972 Elevated prostate specific antigen [PSA]: Secondary | ICD-10-CM | POA: Diagnosis not present

## 2020-09-01 DIAGNOSIS — C61 Malignant neoplasm of prostate: Secondary | ICD-10-CM | POA: Diagnosis not present

## 2020-09-08 DIAGNOSIS — C61 Malignant neoplasm of prostate: Secondary | ICD-10-CM | POA: Diagnosis not present

## 2020-12-16 DIAGNOSIS — M79643 Pain in unspecified hand: Secondary | ICD-10-CM | POA: Diagnosis not present

## 2020-12-16 DIAGNOSIS — M7989 Other specified soft tissue disorders: Secondary | ICD-10-CM | POA: Diagnosis not present

## 2020-12-16 DIAGNOSIS — M1990 Unspecified osteoarthritis, unspecified site: Secondary | ICD-10-CM | POA: Diagnosis not present

## 2020-12-16 DIAGNOSIS — L4050 Arthropathic psoriasis, unspecified: Secondary | ICD-10-CM | POA: Diagnosis not present

## 2020-12-16 DIAGNOSIS — M25569 Pain in unspecified knee: Secondary | ICD-10-CM | POA: Diagnosis not present

## 2020-12-16 DIAGNOSIS — M170 Bilateral primary osteoarthritis of knee: Secondary | ICD-10-CM | POA: Diagnosis not present

## 2020-12-16 DIAGNOSIS — R972 Elevated prostate specific antigen [PSA]: Secondary | ICD-10-CM | POA: Diagnosis not present

## 2020-12-16 DIAGNOSIS — Z79899 Other long term (current) drug therapy: Secondary | ICD-10-CM | POA: Diagnosis not present

## 2020-12-16 DIAGNOSIS — C61 Malignant neoplasm of prostate: Secondary | ICD-10-CM | POA: Diagnosis not present

## 2021-01-13 DIAGNOSIS — I10 Essential (primary) hypertension: Secondary | ICD-10-CM | POA: Diagnosis not present

## 2021-01-13 DIAGNOSIS — E039 Hypothyroidism, unspecified: Secondary | ICD-10-CM | POA: Diagnosis not present

## 2021-01-13 DIAGNOSIS — C61 Malignant neoplasm of prostate: Secondary | ICD-10-CM | POA: Diagnosis not present

## 2021-01-13 DIAGNOSIS — E7800 Pure hypercholesterolemia, unspecified: Secondary | ICD-10-CM | POA: Diagnosis not present

## 2021-01-13 DIAGNOSIS — R7301 Impaired fasting glucose: Secondary | ICD-10-CM | POA: Diagnosis not present

## 2021-01-20 DIAGNOSIS — E7800 Pure hypercholesterolemia, unspecified: Secondary | ICD-10-CM | POA: Diagnosis not present

## 2021-01-20 DIAGNOSIS — I10 Essential (primary) hypertension: Secondary | ICD-10-CM | POA: Diagnosis not present

## 2021-01-20 DIAGNOSIS — K219 Gastro-esophageal reflux disease without esophagitis: Secondary | ICD-10-CM | POA: Diagnosis not present

## 2021-01-20 DIAGNOSIS — R972 Elevated prostate specific antigen [PSA]: Secondary | ICD-10-CM | POA: Diagnosis not present

## 2021-01-20 DIAGNOSIS — L4050 Arthropathic psoriasis, unspecified: Secondary | ICD-10-CM | POA: Diagnosis not present

## 2021-02-23 DIAGNOSIS — C61 Malignant neoplasm of prostate: Secondary | ICD-10-CM | POA: Diagnosis not present

## 2021-03-17 ENCOUNTER — Encounter: Payer: Self-pay | Admitting: Physical Medicine & Rehabilitation

## 2021-04-24 ENCOUNTER — Encounter
Payer: No Typology Code available for payment source | Attending: Physical Medicine & Rehabilitation | Admitting: Physical Medicine & Rehabilitation

## 2021-04-24 ENCOUNTER — Other Ambulatory Visit: Payer: Self-pay

## 2021-04-24 ENCOUNTER — Encounter: Payer: Self-pay | Admitting: Physical Medicine & Rehabilitation

## 2021-04-24 VITALS — BP 126/76 | HR 68 | Temp 98.4°F | Ht 66.5 in | Wt 187.6 lb

## 2021-04-24 DIAGNOSIS — M25561 Pain in right knee: Secondary | ICD-10-CM

## 2021-04-24 DIAGNOSIS — G8929 Other chronic pain: Secondary | ICD-10-CM | POA: Diagnosis present

## 2021-04-24 DIAGNOSIS — M25562 Pain in left knee: Secondary | ICD-10-CM | POA: Diagnosis present

## 2021-04-24 NOTE — Patient Instructions (Signed)
Genicular nerve radiofrequency neurotomy ( AKA radiofrequency ablation) To help with knee pain stemming from arthritis

## 2021-04-24 NOTE — Progress Notes (Signed)
Subjective:    Patient ID: Lucas Davis, male    DOB: 1941/03/11, 80 y.o.   MRN: [id number]  HPI 80 year old male referred by [facility] for the evaluation of bilateral knee pain.  The patient gives a 10-year plus history of knee pain.  He has no history of knee surgery.  No discrete injury.  His pain comes on after walking about a mile.  He does walk his dog every day.  The patient's activity level is good considering his age he does sleep 10 hours a day he sits 2+ hours a day stands 2+ hours a day and walks 2+ hours per day Average pain is 5 out of 10 interferes with activity at a moderate level.  It is a dull type of pain bilaterally he cannot say whether right or left side is worse.  Walking and standing are the main exacerbating factors rest helps with pain.  He is not able to climb any stairs.  He does drive.  He is retired.  Reviewed x-ray report of the right knee showing advanced medial knee compartment osteoarthritis, moderate at the patellofemoral, did not have a report from left knee included with records  Past medical history significant for rheumatoid arthritis check CA, also history of prostate cancer  He is asking specifically about radiofrequency procedure Pain Inventory Average Pain 5 Pain Right Now 4 My pain is intermittent, sharp, dull, and tingling  In the last 24 hours, has pain interfered with the following? General activity 5 Relation with others 5 Enjoyment of life 6 What TIME of day is your pain at its worst? morning , daytime, evening, and night Sleep (in general) Poor  Pain is worse with: walking and standing Pain improves with: rest Relief from Meds:  na  walk without assistance how many minutes can you walk? 15 ability to climb steps?  yes do you drive?  yes  retired  trouble walking  New pt  New pt    Family History  Problem Relation Age of Onset   Heart disease Father 1   Colon cancer Neg Hx    Stomach cancer Neg Hx     Social History   Socioeconomic History   Marital status: Married    Spouse name: Not on file   Number of children: Not on file   Years of education: Not on file   Highest education level: Not on file  Occupational History   Not on file  Tobacco Use   Smoking status: Never   Smokeless tobacco: Never  Vaping Use   Vaping Use: Never used  Substance and Sexual Activity   Alcohol use: Yes    Comment: once a month per pt   Drug use: No   Sexual activity: Not on file  Other Topics Concern   Not on file  Social History Narrative   Lives with wife.  One child.   Two grands.     Social Determinants of Health   Financial Resource Strain: Not on file  Food Insecurity: Not on file  Transportation Needs: Not on file  Physical Activity: Not on file  Stress: Not on file  Social Connections: Not on file   Past Surgical History:  Procedure Laterality Date   COLONOSCOPY  2006,2012   hx colon polyps   POLYPECTOMY     Past Medical History:  Diagnosis Date   Arthritis    GERD (gastroesophageal reflux disease)    Hyperlipidemia    Hypertension    Neuropathy  Prostate cancer (HCC)    BP 126/76 (BP Location: Right Arm, Patient Position: Sitting, Cuff Size: Normal)   Pulse 68   Temp 98.4 F (36.9 C) (Oral)   Ht 5' 6.5" (1.689 m)   Wt 187 lb 9.6 oz (85.1 kg)   SpO2 96%   BMI 29.83 kg/m   Opioid Risk Score:   Fall Risk Score:  `1  Depression screen PHQ 2/9  Depression screen PHQ 2/9 04/24/2021  Decreased Interest 1  Down, Depressed, Hopeless 1  PHQ - 2 Score 2  Altered sleeping 1  Tired, decreased energy 1  Change in appetite 0  Trouble concentrating 0  Suicidal thoughts 0  PHQ-9 Score 4  Difficult doing work/chores Not difficult at all     Review of Systems  Musculoskeletal:  Positive for gait problem.       Bilateral Knee pain  All other systems reviewed and are negative.     Objective:   Physical Exam Vitals and nursing note reviewed.  Constitutional:       Appearance: He is normal weight.  HENT:     Head: Normocephalic and atraumatic.  Eyes:     Extraocular Movements: Extraocular movements intact.     Conjunctiva/sclera: Conjunctivae normal.     Pupils: Pupils are equal, round, and reactive to light.  Cardiovascular:     Rate and Rhythm: Normal rate and regular rhythm.     Pulses: Normal pulses.     Heart sounds: Normal heart sounds.  Abdominal:     General: Abdomen is flat. Bowel sounds are normal. There is no distension.     Palpations: Abdomen is soft.  Musculoskeletal:     Cervical back: Normal range of motion.     Comments: There is no evidence of joint deformity in the hand other than mallet deformity in the right fifth digit Right knee no evidence of effusion no tenderness palpation the medial lateral joint line no tenderness around the patella.  No erythema.  No fullness in the popliteal space right knee range of motion is full without pain Left knee without evidence of effusion or erythema.  There is no tenderness palpation along the medial or lateral joint line no tenderness around the patella no fullness or pain in the popliteal space.  Patient has full range of motion left knee No evidence of joint deformities in either knee.  Skin:    General: Skin is warm and dry.  Neurological:     Mental Status: He is alert and oriented to person, place, and time.     Cranial Nerves: No dysarthria.     Comments: Motor strength is 5/5 bilateral deltoid, bicep, tricep, grip, hip flexor, knee extensor, ankle dorsiflexor and plantar flexor Negative straight leg raising bilaterally         Assessment & Plan:   1.  Bilateral knee pain, osteoarthritis with activity related pain but no significant joint deformity.  We discussed treatment options, has interest in trialing genicular nerve blocks as a means of determining whether radiofrequency neurotomy of the same nerves would be helpful in controlling knee pain.  We discussed that this  is not a permanent fix and the the average duration of response is 6 to 12 months.  He would like to proceed with the trial blocks.  We will do bilateral genicular nerve blocks.  We discussed the need to assess post procedural pain level and in his case he will need to ambulate his usual distance while the blocks are still  in effect.  We will use Marcaine for a longer lasting block.

## 2021-05-01 ENCOUNTER — Other Ambulatory Visit: Payer: Self-pay

## 2021-05-01 ENCOUNTER — Encounter: Payer: Self-pay | Admitting: Physical Medicine & Rehabilitation

## 2021-05-01 ENCOUNTER — Encounter
Payer: No Typology Code available for payment source | Attending: Physical Medicine & Rehabilitation | Admitting: Physical Medicine & Rehabilitation

## 2021-05-01 VITALS — BP 138/76 | HR 60 | Temp 98.9°F | Ht 65.5 in | Wt 187.2 lb

## 2021-05-01 DIAGNOSIS — M25561 Pain in right knee: Secondary | ICD-10-CM | POA: Diagnosis present

## 2021-05-01 DIAGNOSIS — G8929 Other chronic pain: Secondary | ICD-10-CM | POA: Insufficient documentation

## 2021-05-01 DIAGNOSIS — M25562 Pain in left knee: Secondary | ICD-10-CM | POA: Diagnosis present

## 2021-05-01 NOTE — Progress Notes (Signed)
RIght genicular nerve blocks under fluoroscopic guidance  Indication Chronic severe post operative knee pain that has not responded to PT, medication, and other conservative care.  Informed consent was obtained after discussing risks and benefits of procedure with the patient.  These include bleeding, bruising and infection as well as foot numbness. Pt placed in supine position on the fluoro table .  Static images identified distal femur and proximal tibia.  Lateral and medial supracondylar , as well as medial tibial flare prepped with betadine.  Marked under fluoro and then a 25 g 1.5 inch needle was used to anesthetize skin and subcutaneous tissue. 1.5 cc was infiltrate into each of 3 sites. Then a 22-gauge 3.5 inch needle was inserted under fluoroscopic guidance first targeting the medial tibial flare midpoint. After bone contact was made lateral images confirmed proper positioning and Isovue 200 times one ML was injected. This showed no evidence of intravascular uptake. Then 1.5 ML of the solution containing one ML of Celestone 6 mg per mL with 4 ML of .25%marcaincaine was injected. This same procedure was treated for the lateral and medial supracondylar areas. Patient tolerated procedure well. Post procedure instructions given.

## 2021-05-01 NOTE — Progress Notes (Signed)
  PROCEDURE RECORD [facility]   Name: Lucas Davis DOB:17-Mar-1941 MRN: [id number]  Date:05/01/2021  Physician: Alysia Penna, MD    Nurse/CMA: Truman Hayward CMA  Allergies: No Known Allergies  Consent Signed: Yes.    Is patient diabetic? No.  CBG today?   Pregnant: No. LMP: No LMP for male patient. (age 80-55)  Anticoagulants: no Anti-inflammatory: no Antibiotics: no  Procedure: right genicular nerve blocks   Position: Prone Start Time: 2:55 pm  End Time: 3:07pm  Fluoro Time: 36  RN/CMA Caston Coopersmith RN Lee CMA    Time 2:14 3:17pm    BP 138/76 182/72    Pulse 60 62    Respirations 14 14    O2 Sat 97 97    S/S 6 6    Pain Level 3/10 0/10     D/C patient A & O X 3, D/C instructions reviewed, and sits independently.

## 2021-05-01 NOTE — Patient Instructions (Signed)
Genicular nerve blocks were performed today. This is to block pain signals from the knee. A local anesthetic was used to block the knee therefore this will not be a permanent procedure. Please keep track of your pain today and compared to the pain that you had prior to the injection. At next visit we will discuss the results. If it is helpful but only short-term we would need to confirm this with an additional injection prior to proceeding with a radiofrequency neurotomy

## 2021-05-22 ENCOUNTER — Encounter (HOSPITAL_BASED_OUTPATIENT_CLINIC_OR_DEPARTMENT_OTHER): Payer: No Typology Code available for payment source | Admitting: Physical Medicine & Rehabilitation

## 2021-05-22 ENCOUNTER — Other Ambulatory Visit: Payer: Self-pay

## 2021-05-22 ENCOUNTER — Encounter: Payer: Self-pay | Admitting: Physical Medicine & Rehabilitation

## 2021-05-22 VITALS — BP 155/75 | HR 69 | Temp 98.2°F | Ht 67.0 in | Wt 186.0 lb

## 2021-05-22 DIAGNOSIS — M25561 Pain in right knee: Secondary | ICD-10-CM

## 2021-05-22 DIAGNOSIS — G8929 Other chronic pain: Secondary | ICD-10-CM | POA: Diagnosis not present

## 2021-05-22 DIAGNOSIS — M25562 Pain in left knee: Secondary | ICD-10-CM

## 2021-05-22 NOTE — Progress Notes (Signed)
  PROCEDURE RECORD [facility]   Name: Lucas Davis DOB:October 17, 1941 MRN: [id number]  Date:05/22/2021  Physician: Alysia Penna, MD    Nurse/CMA: Debie Ashline, CMA  Allergies: No Known Allergies  Consent Signed: Yes.    Is patient diabetic? No.  CBG today? .   Pregnant: No. LMP: No LMP for male patient. (age 80-55)  Anticoagulants: no Anti-inflammatory: no Antibiotics: no  Procedure: left genicular nerve block  Position: Supine Start Time: 11:53am  End Time: 12:05pm  Fluoro Time: 40s  RN/CMA Wess;ing, CMA Yama Nielson, CMA    Time 11:30am 12:09pm    BP 155/75 179?86    Pulse 69 72    Respirations 14 14    O2 Sat 96 96    S/S 6 6    Pain Level 4/10 1/10     D/C home with self, patient A & O X 3, D/C instructions reviewed, and sits independently.

## 2021-05-22 NOTE — Progress Notes (Signed)
LEFT genicular nerve blocks under fluoroscopic guidance  Indication Chronic severe post operative knee pain that has not responded to PT, medication, and other conservative care.  Informed consent was obtained after discussing risks and benefits of procedure with the patient.  These include bleeding, bruising and infection as well as foot numbness. Pt placed in supine position on the fluoro table .  Static images identified distal femur and proximal tibia.  Lateral and medial supracondylar , as well as medial tibial flare prepped with betadine.  Marked under fluoro and then a 25 g 1.5 inch needle was used to anesthetize skin and subcutaneous tissue. 1.5 cc was infiltrate into each of 3 sites. Then a 22-gauge 3.5 inch needle was inserted under fluoroscopic guidance first targeting the medial tibial flare midpoint. After bone contact was made lateral images confirmed proper positioning and Isovue 200 times one ML was injected. This showed no evidence of intravascular uptake. Then 1.5 ML of the solution containing one ML of Celestone 6 mg per mL with 4 ML of .25%marcaincaine was injected. This same procedure was treated for the lateral and medial supracondylar areas. Patient tolerated procedure well. Post procedure instructions given.

## 2021-06-11 ENCOUNTER — Ambulatory Visit: Payer: No Typology Code available for payment source | Admitting: Physical Medicine & Rehabilitation

## 2021-06-15 DIAGNOSIS — M170 Bilateral primary osteoarthritis of knee: Secondary | ICD-10-CM | POA: Diagnosis not present

## 2021-06-15 DIAGNOSIS — Z79899 Other long term (current) drug therapy: Secondary | ICD-10-CM | POA: Diagnosis not present

## 2021-06-15 DIAGNOSIS — M7989 Other specified soft tissue disorders: Secondary | ICD-10-CM | POA: Diagnosis not present

## 2021-06-15 DIAGNOSIS — R972 Elevated prostate specific antigen [PSA]: Secondary | ICD-10-CM | POA: Diagnosis not present

## 2021-06-15 DIAGNOSIS — C61 Malignant neoplasm of prostate: Secondary | ICD-10-CM | POA: Diagnosis not present

## 2021-06-15 DIAGNOSIS — M79643 Pain in unspecified hand: Secondary | ICD-10-CM | POA: Diagnosis not present

## 2021-06-15 DIAGNOSIS — M1990 Unspecified osteoarthritis, unspecified site: Secondary | ICD-10-CM | POA: Diagnosis not present

## 2021-06-15 DIAGNOSIS — M25569 Pain in unspecified knee: Secondary | ICD-10-CM | POA: Diagnosis not present

## 2021-06-15 DIAGNOSIS — L4050 Arthropathic psoriasis, unspecified: Secondary | ICD-10-CM | POA: Diagnosis not present

## 2021-07-16 DIAGNOSIS — E039 Hypothyroidism, unspecified: Secondary | ICD-10-CM | POA: Diagnosis not present

## 2021-07-16 DIAGNOSIS — I10 Essential (primary) hypertension: Secondary | ICD-10-CM | POA: Diagnosis not present

## 2021-07-16 DIAGNOSIS — R972 Elevated prostate specific antigen [PSA]: Secondary | ICD-10-CM | POA: Diagnosis not present

## 2021-07-16 DIAGNOSIS — R7303 Prediabetes: Secondary | ICD-10-CM | POA: Diagnosis not present

## 2021-07-16 DIAGNOSIS — R7301 Impaired fasting glucose: Secondary | ICD-10-CM | POA: Diagnosis not present

## 2021-07-16 DIAGNOSIS — Z125 Encounter for screening for malignant neoplasm of prostate: Secondary | ICD-10-CM | POA: Diagnosis not present

## 2021-07-16 DIAGNOSIS — E7800 Pure hypercholesterolemia, unspecified: Secondary | ICD-10-CM | POA: Diagnosis not present

## 2021-07-23 DIAGNOSIS — E7800 Pure hypercholesterolemia, unspecified: Secondary | ICD-10-CM | POA: Diagnosis not present

## 2021-07-23 DIAGNOSIS — M1990 Unspecified osteoarthritis, unspecified site: Secondary | ICD-10-CM | POA: Diagnosis not present

## 2021-07-23 DIAGNOSIS — R7303 Prediabetes: Secondary | ICD-10-CM | POA: Diagnosis not present

## 2021-07-23 DIAGNOSIS — I10 Essential (primary) hypertension: Secondary | ICD-10-CM | POA: Diagnosis not present

## 2021-07-23 DIAGNOSIS — K219 Gastro-esophageal reflux disease without esophagitis: Secondary | ICD-10-CM | POA: Diagnosis not present

## 2021-07-23 DIAGNOSIS — Z23 Encounter for immunization: Secondary | ICD-10-CM | POA: Diagnosis not present

## 2021-07-23 DIAGNOSIS — C61 Malignant neoplasm of prostate: Secondary | ICD-10-CM | POA: Diagnosis not present

## 2021-09-16 DIAGNOSIS — R972 Elevated prostate specific antigen [PSA]: Secondary | ICD-10-CM | POA: Diagnosis not present

## 2021-09-16 DIAGNOSIS — M170 Bilateral primary osteoarthritis of knee: Secondary | ICD-10-CM | POA: Diagnosis not present

## 2021-09-16 DIAGNOSIS — C61 Malignant neoplasm of prostate: Secondary | ICD-10-CM | POA: Diagnosis not present

## 2021-09-16 DIAGNOSIS — L4050 Arthropathic psoriasis, unspecified: Secondary | ICD-10-CM | POA: Diagnosis not present

## 2021-09-16 DIAGNOSIS — M8589 Other specified disorders of bone density and structure, multiple sites: Secondary | ICD-10-CM | POA: Diagnosis not present

## 2021-09-16 DIAGNOSIS — M1990 Unspecified osteoarthritis, unspecified site: Secondary | ICD-10-CM | POA: Diagnosis not present

## 2021-09-16 DIAGNOSIS — M79643 Pain in unspecified hand: Secondary | ICD-10-CM | POA: Diagnosis not present

## 2021-09-16 DIAGNOSIS — M8580 Other specified disorders of bone density and structure, unspecified site: Secondary | ICD-10-CM | POA: Diagnosis not present

## 2021-09-16 DIAGNOSIS — Z79899 Other long term (current) drug therapy: Secondary | ICD-10-CM | POA: Diagnosis not present

## 2021-09-16 DIAGNOSIS — M7989 Other specified soft tissue disorders: Secondary | ICD-10-CM | POA: Diagnosis not present

## 2021-09-16 DIAGNOSIS — M25569 Pain in unspecified knee: Secondary | ICD-10-CM | POA: Diagnosis not present

## 2023-06-28 ENCOUNTER — Emergency Department (HOSPITAL_COMMUNITY): Payer: No Typology Code available for payment source

## 2023-06-28 ENCOUNTER — Inpatient Hospital Stay (HOSPITAL_COMMUNITY): Payer: No Typology Code available for payment source

## 2023-06-28 ENCOUNTER — Inpatient Hospital Stay (HOSPITAL_COMMUNITY)
Admission: EM | Admit: 2023-06-28 | Discharge: 2023-07-02 | DRG: 917 | Disposition: A | Discharge status: Home Health | Payer: No Typology Code available for payment source | Attending: Internal Medicine | Admitting: Internal Medicine

## 2023-06-28 DIAGNOSIS — K760 Fatty (change of) liver, not elsewhere classified: Secondary | ICD-10-CM | POA: Diagnosis present

## 2023-06-28 DIAGNOSIS — T447X1A Poisoning by beta-adrenoreceptor antagonists, accidental (unintentional), initial encounter: Secondary | ICD-10-CM | POA: Diagnosis present

## 2023-06-28 DIAGNOSIS — G9340 Encephalopathy, unspecified: Secondary | ICD-10-CM | POA: Diagnosis not present

## 2023-06-28 DIAGNOSIS — N179 Acute kidney failure, unspecified: Secondary | ICD-10-CM | POA: Diagnosis present

## 2023-06-28 DIAGNOSIS — I447 Left bundle-branch block, unspecified: Secondary | ICD-10-CM | POA: Diagnosis present

## 2023-06-28 DIAGNOSIS — E785 Hyperlipidemia, unspecified: Secondary | ICD-10-CM | POA: Diagnosis present

## 2023-06-28 DIAGNOSIS — L89151 Pressure ulcer of sacral region, stage 1: Secondary | ICD-10-CM | POA: Diagnosis present

## 2023-06-28 DIAGNOSIS — U071 COVID-19: Secondary | ICD-10-CM | POA: Diagnosis present

## 2023-06-28 DIAGNOSIS — T447X6A Underdosing of beta-adrenoreceptor antagonists, initial encounter: Secondary | ICD-10-CM | POA: Diagnosis present

## 2023-06-28 DIAGNOSIS — M069 Rheumatoid arthritis, unspecified: Secondary | ICD-10-CM | POA: Diagnosis present

## 2023-06-28 DIAGNOSIS — R4182 Altered mental status, unspecified: Principal | ICD-10-CM

## 2023-06-28 DIAGNOSIS — T447X5A Adverse effect of beta-adrenoreceptor antagonists, initial encounter: Secondary | ICD-10-CM | POA: Diagnosis present

## 2023-06-28 DIAGNOSIS — I4949 Other premature depolarization: Secondary | ICD-10-CM

## 2023-06-28 DIAGNOSIS — Z86010 Personal history of colonic polyps: Secondary | ICD-10-CM

## 2023-06-28 DIAGNOSIS — I952 Hypotension due to drugs: Secondary | ICD-10-CM | POA: Diagnosis not present

## 2023-06-28 DIAGNOSIS — Z7982 Long term (current) use of aspirin: Secondary | ICD-10-CM

## 2023-06-28 DIAGNOSIS — R001 Bradycardia, unspecified: Secondary | ICD-10-CM | POA: Diagnosis present

## 2023-06-28 DIAGNOSIS — E872 Acidosis, unspecified: Secondary | ICD-10-CM | POA: Diagnosis present

## 2023-06-28 DIAGNOSIS — Z888 Allergy status to other drugs, medicaments and biological substances status: Secondary | ICD-10-CM

## 2023-06-28 DIAGNOSIS — I5032 Chronic diastolic (congestive) heart failure: Secondary | ICD-10-CM | POA: Diagnosis present

## 2023-06-28 DIAGNOSIS — Z7952 Long term (current) use of systemic steroids: Secondary | ICD-10-CM

## 2023-06-28 DIAGNOSIS — Z8546 Personal history of malignant neoplasm of prostate: Secondary | ICD-10-CM

## 2023-06-28 DIAGNOSIS — I110 Hypertensive heart disease with heart failure: Secondary | ICD-10-CM | POA: Diagnosis present

## 2023-06-28 DIAGNOSIS — R570 Cardiogenic shock: Secondary | ICD-10-CM | POA: Diagnosis not present

## 2023-06-28 DIAGNOSIS — Z96651 Presence of right artificial knee joint: Secondary | ICD-10-CM | POA: Diagnosis present

## 2023-06-28 DIAGNOSIS — L8990 Pressure ulcer of unspecified site, unspecified stage: Secondary | ICD-10-CM | POA: Insufficient documentation

## 2023-06-28 DIAGNOSIS — Z91148 Patient's other noncompliance with medication regimen for other reason: Secondary | ICD-10-CM | POA: Diagnosis not present

## 2023-06-28 DIAGNOSIS — K219 Gastro-esophageal reflux disease without esophagitis: Secondary | ICD-10-CM | POA: Diagnosis present

## 2023-06-28 DIAGNOSIS — D649 Anemia, unspecified: Secondary | ICD-10-CM | POA: Diagnosis present

## 2023-06-28 DIAGNOSIS — Y92239 Unspecified place in hospital as the place of occurrence of the external cause: Secondary | ICD-10-CM | POA: Diagnosis not present

## 2023-06-28 DIAGNOSIS — Z885 Allergy status to narcotic agent status: Secondary | ICD-10-CM

## 2023-06-28 DIAGNOSIS — Z79899 Other long term (current) drug therapy: Secondary | ICD-10-CM

## 2023-06-28 DIAGNOSIS — G9341 Metabolic encephalopathy: Secondary | ICD-10-CM | POA: Diagnosis present

## 2023-06-28 DIAGNOSIS — M1990 Unspecified osteoarthritis, unspecified site: Secondary | ICD-10-CM | POA: Diagnosis present

## 2023-06-28 DIAGNOSIS — Z8249 Family history of ischemic heart disease and other diseases of the circulatory system: Secondary | ICD-10-CM

## 2023-06-28 DIAGNOSIS — T44995A Adverse effect of other drug primarily affecting the autonomic nervous system, initial encounter: Secondary | ICD-10-CM | POA: Diagnosis not present

## 2023-06-28 DIAGNOSIS — E1142 Type 2 diabetes mellitus with diabetic polyneuropathy: Secondary | ICD-10-CM | POA: Diagnosis present

## 2023-06-28 LAB — I-STAT CHEM 8, ED
BUN: 24 mg/dL — ABNORMAL HIGH (ref 8–23)
Calcium, Ion: 1.17 mmol/L (ref 1.15–1.40)
Chloride: 105 mmol/L (ref 98–111)
Creatinine, Ser: 1.4 mg/dL — ABNORMAL HIGH (ref 0.61–1.24)
Glucose, Bld: 116 mg/dL — ABNORMAL HIGH (ref 70–99)
HCT: 35 % — ABNORMAL LOW (ref 39.0–52.0)
Hemoglobin: 11.9 g/dL — ABNORMAL LOW (ref 13.0–17.0)
Potassium: 3.9 mmol/L (ref 3.5–5.1)
Sodium: 141 mmol/L (ref 135–145)
TCO2: 25 mmol/L (ref 22–32)

## 2023-06-28 LAB — COMPREHENSIVE METABOLIC PANEL
ALT: 16 U/L (ref 0–44)
AST: 21 U/L (ref 15–41)
Albumin: 3.3 g/dL — ABNORMAL LOW (ref 3.5–5.0)
Alkaline Phosphatase: 65 U/L (ref 38–126)
Anion gap: 12 (ref 5–15)
BUN: 24 mg/dL — ABNORMAL HIGH (ref 8–23)
CO2: 20 mmol/L — ABNORMAL LOW (ref 22–32)
Calcium: 8.3 mg/dL — ABNORMAL LOW (ref 8.9–10.3)
Chloride: 105 mmol/L (ref 98–111)
Creatinine, Ser: 1.31 mg/dL — ABNORMAL HIGH (ref 0.61–1.24)
GFR, Estimated: 54 mL/min — ABNORMAL LOW (ref >60)
Glucose, Bld: 127 mg/dL — ABNORMAL HIGH (ref 70–99)
Potassium: 4 mmol/L (ref 3.5–5.1)
Sodium: 137 mmol/L (ref 135–145)
Total Bilirubin: 0.5 mg/dL (ref 0.3–1.2)
Total Protein: 6.3 g/dL — ABNORMAL LOW (ref 6.5–8.1)

## 2023-06-28 LAB — ECHOCARDIOGRAM COMPLETE
AR max vel: 2.51 cm2
AV Area VTI: 2.57 cm2
AV Area mean vel: 2.64 cm2
AV Mean grad: 7 mmHg
AV Peak grad: 14.6 mmHg
Ao pk vel: 1.91 m/s
Area-P 1/2: 2.48 cm2
P 1/2 time: 675 msec
S' Lateral: 3.6 cm
Weight: 2821.89 oz

## 2023-06-28 LAB — CBG MONITORING, ED: Glucose-Capillary: 133 mg/dL — ABNORMAL HIGH (ref 70–99)

## 2023-06-28 LAB — AMMONIA: Ammonia: 23 umol/L (ref 9–35)

## 2023-06-28 LAB — I-STAT VENOUS BLOOD GAS, ED
Acid-base deficit: 3 mmol/L — ABNORMAL HIGH (ref 0.0–2.0)
Bicarbonate: 23.8 mmol/L (ref 20.0–28.0)
Calcium, Ion: 1.15 mmol/L (ref 1.15–1.40)
HCT: 35 % — ABNORMAL LOW (ref 39.0–52.0)
Hemoglobin: 11.9 g/dL — ABNORMAL LOW (ref 13.0–17.0)
O2 Saturation: 88 %
Potassium: 3.9 mmol/L (ref 3.5–5.1)
Sodium: 141 mmol/L (ref 135–145)
TCO2: 25 mmol/L (ref 22–32)
pCO2, Ven: 49.8 mmHg (ref 44–60)
pH, Ven: 7.288 (ref 7.25–7.43)
pO2, Ven: 61 mmHg — ABNORMAL HIGH (ref 32–45)

## 2023-06-28 LAB — CBC WITH DIFFERENTIAL/PLATELET
Abs Immature Granulocytes: 0.02 10*3/uL (ref 0.00–0.07)
Basophils Absolute: 0 10*3/uL (ref 0.0–0.1)
Basophils Relative: 1 %
Eosinophils Absolute: 0.2 10*3/uL (ref 0.0–0.5)
Eosinophils Relative: 3 %
HCT: 37.6 % — ABNORMAL LOW (ref 39.0–52.0)
Hemoglobin: 11.7 g/dL — ABNORMAL LOW (ref 13.0–17.0)
Immature Granulocytes: 0 %
Lymphocytes Relative: 40 %
Lymphs Abs: 2.9 10*3/uL (ref 0.7–4.0)
MCH: 29.8 pg (ref 26.0–34.0)
MCHC: 31.1 g/dL (ref 30.0–36.0)
MCV: 95.7 fL (ref 80.0–100.0)
Monocytes Absolute: 0.8 10*3/uL (ref 0.1–1.0)
Monocytes Relative: 11 %
Neutro Abs: 3.3 10*3/uL (ref 1.7–7.7)
Neutrophils Relative %: 45 %
Platelets: 171 10*3/uL (ref 150–400)
RBC: 3.93 MIL/uL — ABNORMAL LOW (ref 4.22–5.81)
RDW: 14.4 % (ref 11.5–15.5)
WBC: 7.3 10*3/uL (ref 4.0–10.5)
nRBC: 0 % (ref 0.0–0.2)

## 2023-06-28 LAB — RAPID URINE DRUG SCREEN, HOSP PERFORMED
Amphetamines: NOT DETECTED
Barbiturates: NOT DETECTED
Benzodiazepines: NOT DETECTED
Cocaine: NOT DETECTED
Opiates: NOT DETECTED
Tetrahydrocannabinol: NOT DETECTED

## 2023-06-28 LAB — TSH: TSH: 0.989 u[IU]/mL (ref 0.350–4.500)

## 2023-06-28 LAB — BRAIN NATRIURETIC PEPTIDE: B Natriuretic Peptide: 262 pg/mL — ABNORMAL HIGH (ref 0.0–100.0)

## 2023-06-28 LAB — HEMOGLOBIN A1C
Hgb A1c MFr Bld: 6.5 % — ABNORMAL HIGH (ref 4.8–5.6)
Mean Plasma Glucose: 139.85 mg/dL

## 2023-06-28 LAB — T4, FREE: Free T4: 0.69 ng/dL (ref 0.61–1.12)

## 2023-06-28 LAB — RESP PANEL BY RT-PCR (RSV, FLU A&B, COVID)  RVPGX2
Influenza A by PCR: NEGATIVE
Influenza B by PCR: NEGATIVE
Resp Syncytial Virus by PCR: NEGATIVE
SARS Coronavirus 2 by RT PCR: POSITIVE — AB

## 2023-06-28 LAB — ETHANOL: Alcohol, Ethyl (B): <10 mg/dL (ref <10)

## 2023-06-28 LAB — MRSA NEXT GEN BY PCR, NASAL: MRSA by PCR Next Gen: NOT DETECTED

## 2023-06-28 LAB — GLUCOSE, CAPILLARY
Glucose-Capillary: 76 mg/dL (ref 70–99)
Glucose-Capillary: 168 mg/dL — ABNORMAL HIGH (ref 70–99)
Glucose-Capillary: 187 mg/dL — ABNORMAL HIGH (ref 70–99)

## 2023-06-28 LAB — LACTIC ACID, PLASMA: Lactic Acid, Venous: 1.5 mmol/L (ref 0.5–1.9)

## 2023-06-28 LAB — TROPONIN I (HIGH SENSITIVITY)
Troponin I (High Sensitivity): 18 ng/L — ABNORMAL HIGH (ref <18)
Troponin I (High Sensitivity): 18 ng/L — ABNORMAL HIGH (ref <18)
Troponin I (High Sensitivity): 18 ng/L — ABNORMAL HIGH (ref <18)

## 2023-06-28 LAB — MAGNESIUM: Magnesium: 1.7 mg/dL (ref 1.7–2.4)

## 2023-06-28 LAB — PROCALCITONIN: Procalcitonin: <0.1 ng/mL

## 2023-06-28 MED ORDER — DOCUSATE SODIUM 100 MG PO CAPS
100.0000 mg | ORAL_CAPSULE | Freq: Two times a day (BID) | ORAL | Status: DC | PRN
  Administered 2023-06-30: 100 mg via ORAL
  Filled 2023-06-28: qty 1

## 2023-06-28 MED ORDER — ONDANSETRON HCL 4 MG/2ML IJ SOLN
4.0000 mg | Freq: Three times a day (TID) | INTRAMUSCULAR | Status: AC | PRN
  Administered 2023-06-28 – 2023-06-30 (×2): 4 mg via INTRAVENOUS
  Filled 2023-06-28 (×2): qty 2

## 2023-06-28 MED ORDER — GLUCAGON HCL RDNA (DIAGNOSTIC) 1 MG IJ SOLR
1.0000 mg | Freq: Once | INTRAMUSCULAR | Status: AC
  Administered 2023-06-28: 1 mg via INTRAVENOUS
  Filled 2023-06-28: qty 1

## 2023-06-28 MED ORDER — MAGNESIUM SULFATE 2 GM/50ML IV SOLN
2.0000 g | Freq: Once | INTRAVENOUS | Status: AC
  Administered 2023-06-28: 2 g via INTRAVENOUS
  Filled 2023-06-28: qty 50

## 2023-06-28 MED ORDER — DOPAMINE-DEXTROSE 3.2-5 MG/ML-% IV SOLN
0.0000 ug/kg/min | INTRAVENOUS | Status: DC
  Administered 2023-06-28: 5 ug/kg/min via INTRAVENOUS
  Administered 2023-06-28 – 2023-06-29 (×2): 20 ug/kg/min via INTRAVENOUS
  Administered 2023-06-29: 15 ug/kg/min via INTRAVENOUS
  Administered 2023-06-30: 5 ug/kg/min via INTRAVENOUS
  Filled 2023-06-28 (×6): qty 250

## 2023-06-28 MED ORDER — SODIUM CHLORIDE 0.9 % IV BOLUS
1000.0000 mL | Freq: Once | INTRAVENOUS | Status: AC
  Administered 2023-06-28: 1000 mL via INTRAVENOUS

## 2023-06-28 MED ORDER — ATROPINE SULFATE 1 MG/10ML IJ SOSY
1.0000 mg | PREFILLED_SYRINGE | Freq: Once | INTRAMUSCULAR | Status: AC
  Administered 2023-06-28: 1 mg via INTRAVENOUS

## 2023-06-28 MED ORDER — GLUCAGON HCL RDNA (DIAGNOSTIC) 1 MG IJ SOLR: 5.0000 mg | Freq: Once | INTRAMUSCULAR | Status: DC

## 2023-06-28 MED ORDER — BISACODYL 10 MG RE SUPP
10.0000 mg | Freq: Every day | RECTAL | Status: DC | PRN
  Administered 2023-06-29 – 2023-06-30 (×2): 10 mg via RECTAL
  Filled 2023-06-28 (×2): qty 1

## 2023-06-28 MED ORDER — SODIUM BICARBONATE 8.4 % IV SOLN
50.0000 meq | Freq: Once | INTRAVENOUS | Status: AC
  Administered 2023-06-28: 50 meq via INTRAVENOUS
  Filled 2023-06-28: qty 50

## 2023-06-28 MED ORDER — ONDANSETRON HCL 4 MG/2ML IJ SOLN: 4.0000 mg | Freq: Once | INTRAMUSCULAR | Status: DC

## 2023-06-28 MED ORDER — CHLORHEXIDINE GLUCONATE CLOTH 2 % EX PADS
6.0000 | MEDICATED_PAD | Freq: Every day | CUTANEOUS | Status: DC
  Administered 2023-06-28 – 2023-07-01 (×4): 6 via TOPICAL

## 2023-06-28 MED ORDER — SODIUM CHLORIDE 0.9 % IV SOLN
100.0000 mg | Freq: Every day | INTRAVENOUS | Status: DC
  Filled 2023-06-28: qty 20

## 2023-06-28 MED ORDER — GLUCAGON HCL RDNA (DIAGNOSTIC) 1 MG IJ SOLR
5.0000 mg | Freq: Once | INTRAVENOUS | Status: DC
  Filled 2023-06-28: qty 5

## 2023-06-28 MED ORDER — IOHEXOL 350 MG/ML SOLN
75.0000 mL | Freq: Once | INTRAVENOUS | Status: AC | PRN
  Administered 2023-06-28: 75 mL via INTRAVENOUS

## 2023-06-28 MED ORDER — ATROPINE SULFATE 1 MG/10ML IJ SOSY
1.0000 mg | PREFILLED_SYRINGE | Freq: Once | INTRAMUSCULAR | Status: AC
  Administered 2023-06-28: 1 mg via INTRAVENOUS
  Filled 2023-06-28: qty 10

## 2023-06-28 MED ORDER — SODIUM CHLORIDE 0.9 % IV SOLN
200.0000 mg | Freq: Once | INTRAVENOUS | Status: AC
  Administered 2023-06-28: 200 mg via INTRAVENOUS
  Filled 2023-06-28: qty 40

## 2023-06-28 MED ORDER — ATROPINE SULFATE 1 MG/ML IV SOLN
1.0000 mg | Freq: Once | INTRAVENOUS | Status: DC
  Filled 2023-06-28: qty 1

## 2023-06-28 MED ORDER — ORAL CARE MOUTH RINSE: 15.0000 mL | OROMUCOSAL | Status: DC | PRN

## 2023-06-28 MED ORDER — LACTATED RINGERS IV BOLUS
500.0000 mL | Freq: Once | INTRAVENOUS | Status: AC
  Administered 2023-06-28: 500 mL via INTRAVENOUS

## 2023-06-28 MED ORDER — GLUCAGON HCL RDNA (DIAGNOSTIC) 1 MG IJ SOLR: 1.0000 mg | Freq: Once | INTRAMUSCULAR | Status: DC

## 2023-06-28 MED ORDER — POLYETHYLENE GLYCOL 3350 17 G PO PACK
17.0000 g | PACK | Freq: Every day | ORAL | Status: DC | PRN
  Administered 2023-06-30: 17 g via ORAL
  Filled 2023-06-28: qty 1
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
## 2023-06-28 NOTE — Progress Notes (Signed)
Pt arrived to unit with 1 black wallet with: (6) $100 bills [employer] [place] Card  Additional black wallet with: (1) Check to him for $100 (2) $100 bill (7) $20 Bills (1) $10 Bills (5) $5 Bills (6) $1 Bills Medicare Card [employer] Card Drivers License [employer] ID [employer] Numerous other miscellaneous cards  Cell phone Jeans  Shirt Underwear  Shoes (2) sets of keys (2) hearing aids  Belongings sent home with wife [place] Dos[place]. Witnessed by Wendie Chess, RN  Hearing aids and clothing left at bedside.
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
## 2023-06-28 NOTE — Progress Notes (Addendum)
Spouse, Dianna Fetting listed in chart. [phone] (H), called w/ no answer. Additionally found a work number [phone] which went to voicemail of a business, no message left given uncertainty.    Will continue to try and reach [facility].    Addendum: wife, Dianna in waiting room.  States pt has had issues with intermittent slurred speech over 2 years waking up in the morning.  [facility] a [facility] and insomnia but also states he sleeps a lot and takes him awhile to wake up in the morning.  Concerns for OSA.  Pt just had R knee replacement at the [place] 3 weeks ago, stayed one night.  Said he had some low BP then but they didn't seem concerned.  Did not react well to narcotics> hallucinated.  States he takes many medications and its confusing but doubts he took too much.  Will bring them in tomorrow.  No recent illness, noticed a cough last night but has recently tested neg for COVID at [facility].  Reports in the last several months, he also has been waking up with dizziness.  Pt former Information systems manager.  Code status> full for now.  Has advanced directives at home will review/ bring in.        Posey Boyer, MSN, AG-ACNP-BC [place] [facility] 06/28/2023, 4:01 PM  See Amion for pager If no response to pager , please call [phone] until 7pm After 7:00 pm call Elink  [phone]

## 2023-06-28 NOTE — Consult Note (Signed)
   Brief consult note-full note to follow  Patient briefly seen and evaluated-not very responsive and therefore history all obtained from talking to the patient nurse and reviewing the chart.  Presented with altered mental status and bradycardic found to be COVID-positive.  Was in profound sinus bradycardia junctional escape rhythm.  Apparently was given atropine and glucagon on initial evaluation and was started on dopamine infusion.  He is now currently in sinus bradycardia with occasional PVCs and blocked PACs noted on telemetry as pauses.  No longer is in junctional rhythm.  Has baseline left bundle branch block.  Talking to the patient's nurse, she had mentioned that she discussed with the patient's wife who does feel like he probably was taking his propranolol 10 mg twice daily.  Had previously been some question of whether or not he was or was not taking it.  If he was indeed taking it, then the appropriate action has been already been taken including initial improvement with glucagon/atropine followed by dopamine infusion.  The recommendation will be to continue to hold beta-blocker (but not has been on or not), and use dopamine to help heart rate above 50-55 and maintain stable MAP per PCCM.  Will continue to monitor, but no acute need for temporary pacemaker placement as he is hemodynamically stable.  Will discuss with the EP tomorrow, but will still likely still need more time for washout.  (Suspect that bradycardia was exacerbated by his COVID.)   Bryan Lemma, MD

## 2023-06-28 NOTE — Progress Notes (Signed)
Wife at bedside visiting. Per previous note by Victory Dakin, Barry Brunner x2 with cellphone returned to wife Lafonda Mosses. Clothing, shoes and bilateral hearing aids left at bedside.

## 2023-06-29 ENCOUNTER — Encounter (HOSPITAL_COMMUNITY): Payer: Self-pay | Admitting: Pulmonary Disease

## 2023-06-29 ENCOUNTER — Other Ambulatory Visit: Payer: Self-pay

## 2023-06-29 DIAGNOSIS — R570 Cardiogenic shock: Secondary | ICD-10-CM

## 2023-06-29 DIAGNOSIS — R001 Bradycardia, unspecified: Secondary | ICD-10-CM | POA: Diagnosis not present

## 2023-06-29 LAB — RENAL FUNCTION PANEL
Albumin: 3.8 g/dL (ref 3.5–5.0)
Anion gap: 9 (ref 5–15)
BUN: 13 mg/dL (ref 8–23)
CO2: 25 mmol/L (ref 22–32)
Calcium: 9 mg/dL (ref 8.9–10.3)
Chloride: 104 mmol/L (ref 98–111)
Creatinine, Ser: 1.31 mg/dL — ABNORMAL HIGH (ref 0.61–1.24)
GFR, Estimated: 54 mL/min — ABNORMAL LOW (ref >60)
Glucose, Bld: 132 mg/dL — ABNORMAL HIGH (ref 70–99)
Phosphorus: 3.6 mg/dL (ref 2.5–4.6)
Potassium: 4 mmol/L (ref 3.5–5.1)
Sodium: 138 mmol/L (ref 135–145)

## 2023-06-29 LAB — CBC
HCT: 42.4 % (ref 39.0–52.0)
Hemoglobin: 14.2 g/dL (ref 13.0–17.0)
MCH: 29.8 pg (ref 26.0–34.0)
MCHC: 33.5 g/dL (ref 30.0–36.0)
MCV: 89.1 fL (ref 80.0–100.0)
Platelets: 201 10*3/uL (ref 150–400)
RBC: 4.76 MIL/uL (ref 4.22–5.81)
RDW: 13.6 % (ref 11.5–15.5)
WBC: 10.7 10*3/uL — ABNORMAL HIGH (ref 4.0–10.5)
nRBC: 0 % (ref 0.0–0.2)

## 2023-06-29 LAB — MAGNESIUM: Magnesium: 2.2 mg/dL (ref 1.7–2.4)

## 2023-06-29 LAB — GLUCOSE, CAPILLARY
Glucose-Capillary: 120 mg/dL — ABNORMAL HIGH (ref 70–99)
Glucose-Capillary: 138 mg/dL — ABNORMAL HIGH (ref 70–99)
Glucose-Capillary: 171 mg/dL — ABNORMAL HIGH (ref 70–99)
Glucose-Capillary: 128 mg/dL — ABNORMAL HIGH (ref 70–99)
Glucose-Capillary: 173 mg/dL — ABNORMAL HIGH (ref 70–99)
Glucose-Capillary: 130 mg/dL — ABNORMAL HIGH (ref 70–99)

## 2023-06-29 MED ORDER — SODIUM CHLORIDE 0.9% FLUSH: 10.0000 mL | INTRAVENOUS | Status: DC | PRN

## 2023-06-29 MED ORDER — ENSURE ENLIVE PO LIQD
237.0000 mL | Freq: Two times a day (BID) | ORAL | Status: DC
  Administered 2023-06-29 – 2023-07-01 (×5): 237 mL via ORAL

## 2023-06-29 MED ORDER — CALCIUM GLUCONATE-NACL 2-0.675 GM/100ML-% IV SOLN
2.0000 g | Freq: Once | INTRAVENOUS | Status: AC
  Administered 2023-06-29: 2000 mg via INTRAVENOUS
  Filled 2023-06-29: qty 100

## 2023-06-29 MED ORDER — ENOXAPARIN SODIUM 40 MG/0.4ML IJ SOSY
40.0000 mg | PREFILLED_SYRINGE | INTRAMUSCULAR | Status: DC
  Administered 2023-06-29 – 2023-07-01 (×3): 40 mg via SUBCUTANEOUS
  Filled 2023-06-29 (×4): qty 0.4

## 2023-06-29 MED ORDER — SODIUM CHLORIDE 0.9% FLUSH
10.0000 mL | Freq: Two times a day (BID) | INTRAVENOUS | Status: DC
  Administered 2023-06-30 – 2023-07-01 (×3): 10 mL
  Administered 2023-07-01: 20 mL

## 2023-06-29 MED ORDER — ACETAMINOPHEN 325 MG PO TABS: 650.0000 mg | ORAL_TABLET | Freq: Four times a day (QID) | ORAL | Status: DC | PRN

## 2023-06-29 NOTE — Progress Notes (Signed)
Peripherally Inserted Central Catheter Placement  The IV Nurse has discussed with the patient and/or persons authorized to consent for the patient, the purpose of this procedure and the potential benefits and risks involved with this procedure.  The benefits include less needle sticks, lab draws from the catheter, and the patient may be discharged home with the catheter. Risks include, but not limited to, infection, bleeding, blood clot (thrombus formation), and puncture of an artery; nerve damage and irregular heartbeat and possibility to perform a PICC exchange if needed/ordered by physician.  Alternatives to this procedure were also discussed.  Bard Power PICC patient education guide, fact sheet on infection prevention and patient information card has been provided to patient /or left at bedside.    PICC Placement Documentation  PICC Double Lumen 06/29/23 Right Basilic 40 cm 0 cm (Active)  Indication for Insertion or Continuance of Line Vasoactive infusions 06/29/23 1553  Exposed Catheter (cm) 0 cm 06/29/23 1553  Site Assessment Clean, Dry, Intact 06/29/23 1553  Lumen #1 Status Flushed;Saline locked;Blood return noted 06/29/23 1553  Lumen #2 Status Flushed;Saline locked;Blood return noted 06/29/23 1553  Dressing Type Transparent;Securing device 06/29/23 1553  Dressing Status Antimicrobial disc in place;Clean, Dry, Intact 06/29/23 1553  Line Adjustment (NICU/IV Team Only) No 06/29/23 1553  Dressing Intervention New dressing;Adhesive placed around edges of dressing (IV team/ICU RN only);Other (Comment) 06/29/23 1553  Dressing Change Due 07/06/23 06/29/23 1553       Lucas Davis 06/29/2023, 3:54 PM
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
## 2023-06-29 NOTE — Progress Notes (Signed)
    Brief follow-up note:  Patient continues to have significant bradycardia requiring high-dose dopamine to maintain sinus rhythm in the 50s and stable blood pressures.  Sensorium has improved.  He apparently was taking 20 mg TID of propranolol (initially stating which medicine he had held-he was holding his pain medications).  That dose of propranolol will take several days to washout and therefore I suspect he will continue to require dopamine may be at lower doses but still relatively significant dose for several days.  Would recommend new central line or PICC line placement to allow for safe for administration of dopamine.  Would like to gradually wean down as opposed to try to wean all the way off.  We can shoot for heart rates in the mid to mid to high 40s or higher as long as her P waves noted.  Will need to have consistent discussion with the EP as time goes by to see how well his heart rate recovery continues while we are waiting for beta-blocker wean.  Still no acute indication for temp wire placement.   No other acute recommendations.  Will continue to monitor.    Bryan Lemma, MD

## 2023-06-29 NOTE — TOC CM/SW Note (Signed)
Transition of Care [facility]) - Inpatient Brief Assessment   Patient Details  Name: Lucas Davis MRN: [id number] Date of Birth: 08-Oct-1941  Transition of Care [facility]) CM/SW Contact:    Gala Lewandowsky, RN Phone Number: 06/29/2023, 11:04 AM   Clinical Narrative: Patient presented for AMS and bradycardia. Patient has a VA Benefit. Case Manager called the [facility] Coordinator to make them aware of hospitalization. Patient is a member of the [employer] is Dr. Su Hilt and CSW is Delila Pereyra @ [phone] ext [phone]. Case Manager will continue to follow for additional transition of care needs.    Transition of Care Asessment: Insurance and Status: Insurance coverage has been reviewed Patient has primary care physician: Yes Prior/Current Home Services: No current home services Social Determinants of Health Reivew: SDOH reviewed no interventions necessary Readmission risk has been reviewed: Yes Transition of care needs: no transition of care needs at this time

## 2023-06-30 DIAGNOSIS — U071 COVID-19: Secondary | ICD-10-CM

## 2023-06-30 DIAGNOSIS — N179 Acute kidney failure, unspecified: Secondary | ICD-10-CM | POA: Diagnosis not present

## 2023-06-30 DIAGNOSIS — L8990 Pressure ulcer of unspecified site, unspecified stage: Secondary | ICD-10-CM | POA: Insufficient documentation

## 2023-06-30 DIAGNOSIS — R001 Bradycardia, unspecified: Secondary | ICD-10-CM | POA: Diagnosis not present

## 2023-06-30 DIAGNOSIS — I952 Hypotension due to drugs: Secondary | ICD-10-CM

## 2023-06-30 DIAGNOSIS — G9340 Encephalopathy, unspecified: Secondary | ICD-10-CM | POA: Diagnosis not present

## 2023-06-30 HISTORY — DX: COVID-19: U07.1

## 2023-06-30 LAB — RENAL FUNCTION PANEL
Albumin: 3 g/dL — ABNORMAL LOW (ref 3.5–5.0)
Anion gap: 6 (ref 5–15)
BUN: 17 mg/dL (ref 8–23)
CO2: 27 mmol/L (ref 22–32)
Calcium: 8.7 mg/dL — ABNORMAL LOW (ref 8.9–10.3)
Chloride: 101 mmol/L (ref 98–111)
Creatinine, Ser: 1.18 mg/dL (ref 0.61–1.24)
GFR, Estimated: >60 mL/min (ref >60)
Glucose, Bld: 123 mg/dL — ABNORMAL HIGH (ref 70–99)
Phosphorus: 4.4 mg/dL (ref 2.5–4.6)
Potassium: 3.7 mmol/L (ref 3.5–5.1)
Sodium: 134 mmol/L — ABNORMAL LOW (ref 135–145)

## 2023-06-30 LAB — CBC
HCT: 34.8 % — ABNORMAL LOW (ref 39.0–52.0)
Hemoglobin: 11.6 g/dL — ABNORMAL LOW (ref 13.0–17.0)
MCH: 30.1 pg (ref 26.0–34.0)
MCHC: 33.3 g/dL (ref 30.0–36.0)
MCV: 90.2 fL (ref 80.0–100.0)
Platelets: 173 10*3/uL (ref 150–400)
RBC: 3.86 MIL/uL — ABNORMAL LOW (ref 4.22–5.81)
RDW: 14 % (ref 11.5–15.5)
WBC: 10.5 10*3/uL (ref 4.0–10.5)
nRBC: 0 % (ref 0.0–0.2)

## 2023-06-30 LAB — GLUCOSE, CAPILLARY
Glucose-Capillary: 123 mg/dL — ABNORMAL HIGH (ref 70–99)
Glucose-Capillary: 126 mg/dL — ABNORMAL HIGH (ref 70–99)
Glucose-Capillary: 112 mg/dL — ABNORMAL HIGH (ref 70–99)
Glucose-Capillary: 119 mg/dL — ABNORMAL HIGH (ref 70–99)
Glucose-Capillary: 127 mg/dL — ABNORMAL HIGH (ref 70–99)

## 2023-06-30 LAB — MAGNESIUM: Magnesium: 1.9 mg/dL (ref 1.7–2.4)

## 2023-06-30 MED ORDER — POTASSIUM CHLORIDE CRYS ER 20 MEQ PO TBCR
40.0000 meq | EXTENDED_RELEASE_TABLET | Freq: Once | ORAL | Status: AC
  Administered 2023-06-30: 40 meq via ORAL
  Filled 2023-06-30: qty 2

## 2023-06-30 NOTE — Progress Notes (Signed)
   Pt remains on low dose Dopamine -- unable to fully wean. May still take another 1-2 days to fully wash out Propranolol.    Will continue to monitor.    DH

## 2023-06-30 NOTE — Progress Notes (Signed)
[facility] ADULT ICU REPLACEMENT PROTOCOL   The patient does apply for the [facility] Adult ICU Electrolyte Replacment Protocol based on the criteria listed below:   1.Exclusion criteria: TCTS, ECMO, Dialysis, and Myasthenia Gravis patients 2. Is GFR >/= 30 ml/min? Yes.    Patient's GFR today is >60 3. Is SCr </= 2? Yes.   Patient's SCr is 1.18 mg/dL 4. Did SCr increase >/= 0.5 in 24 hours? No. 5.Pt's weight >40kg  Yes.   6. Abnormal electrolyte(s):   K 3.7  7. Electrolytes replaced per protocol 8.  Call MD STAT for K+ </= 2.5, Phos </= 1, or Mag </= 1 Physician:  A. Paliwal  Misty Stanley R Jaycob Mcclenton 06/30/2023 5:25 AM
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
## 2023-07-01 DIAGNOSIS — R001 Bradycardia, unspecified: Secondary | ICD-10-CM | POA: Diagnosis not present

## 2023-07-01 LAB — MAGNESIUM: Magnesium: 1.8 mg/dL (ref 1.7–2.4)

## 2023-07-01 LAB — CBC
HCT: 35.3 % — ABNORMAL LOW (ref 39.0–52.0)
Hemoglobin: 11.9 g/dL — ABNORMAL LOW (ref 13.0–17.0)
MCH: 31.3 pg (ref 26.0–34.0)
MCHC: 33.7 g/dL (ref 30.0–36.0)
MCV: 92.9 fL (ref 80.0–100.0)
Platelets: 169 10*3/uL (ref 150–400)
RBC: 3.8 MIL/uL — ABNORMAL LOW (ref 4.22–5.81)
RDW: 13.9 % (ref 11.5–15.5)
WBC: 7 10*3/uL (ref 4.0–10.5)
nRBC: 0 % (ref 0.0–0.2)

## 2023-07-01 LAB — GLUCOSE, CAPILLARY
Glucose-Capillary: 144 mg/dL — ABNORMAL HIGH (ref 70–99)
Glucose-Capillary: 152 mg/dL — ABNORMAL HIGH (ref 70–99)
Glucose-Capillary: 166 mg/dL — ABNORMAL HIGH (ref 70–99)
Glucose-Capillary: 134 mg/dL — ABNORMAL HIGH (ref 70–99)
Glucose-Capillary: 91 mg/dL (ref 70–99)

## 2023-07-01 LAB — RENAL FUNCTION PANEL
Albumin: 2.8 g/dL — ABNORMAL LOW (ref 3.5–5.0)
Anion gap: 8 (ref 5–15)
BUN: 13 mg/dL (ref 8–23)
CO2: 26 mmol/L (ref 22–32)
Calcium: 8.5 mg/dL — ABNORMAL LOW (ref 8.9–10.3)
Chloride: 98 mmol/L (ref 98–111)
Creatinine, Ser: 0.9 mg/dL (ref 0.61–1.24)
GFR, Estimated: >60 mL/min (ref >60)
Glucose, Bld: 228 mg/dL — ABNORMAL HIGH (ref 70–99)
Phosphorus: 3.6 mg/dL (ref 2.5–4.6)
Potassium: 3.8 mmol/L (ref 3.5–5.1)
Sodium: 132 mmol/L — ABNORMAL LOW (ref 135–145)

## 2023-07-01 MED ORDER — MAGNESIUM SULFATE 2 GM/50ML IV SOLN
2.0000 g | Freq: Once | INTRAVENOUS | Status: AC
  Administered 2023-07-01: 2 g via INTRAVENOUS
  Filled 2023-07-01: qty 50

## 2023-07-01 MED ORDER — POTASSIUM CHLORIDE CRYS ER 20 MEQ PO TBCR
20.0000 meq | EXTENDED_RELEASE_TABLET | Freq: Once | ORAL | Status: AC
  Administered 2023-07-01: 20 meq via ORAL
  Filled 2023-07-01: qty 1
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
## 2023-07-01 NOTE — Progress Notes (Signed)
   Patient remains on low-dose dopamine but heart rate seem to be stabilizing.  Would like to be out of wean off of dopamine during the day today and assess off of dopamine to see what his heart rate does over the course of the next day.  Would simply not restart beta-blocker.  As long as he remains with stable heart rates over 45 to 50 bpm at rest off of dopamine, no indication for pacemaker.  EP also aware.  Bryan Lemma, MD
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
## 2023-07-01 NOTE — Plan of Care (Signed)

## 2023-07-01 NOTE — Plan of Care (Signed)

## 2023-07-01 NOTE — Evaluation (Signed)
Physical Therapy Brief Evaluation and Discharge Note Patient Details Name: Lucas Davis MRN: [id number] DOB: [birth year] Today's Date: 07/01/2023   History of Present Illness  Pt is 82 year old presented to [facility] on  [facility] for symptomatic bradycardia. Pt Covid+ which felt to be incidental finding. PMH - rt TKR July 2024, DM, HTN, RA, OA  Clinical Impression  Pt doing well with mobility and no further acute PT needed.  Ready for dc from PT standpoint. Recommended to pt that he use rolling walker while amb here on his own. Recommend resuming PT services that he was receiving for TKR prior to hospitalization.         PT Assessment All further PT needs can be met in the next venue of care  Assistance Needed at Discharge  PRN    Equipment Recommendations None recommended by PT  Recommendations for Other Services       Precautions/Restrictions Precautions Precautions: None Restrictions Weight Bearing Restrictions: No        Mobility  Bed Mobility   Supine/Sidelying to sit: Modified independent (Device/Increased time) Sit to supine/sidelying: Modified independent (Device/Increased time)    Transfers Overall transfer level: Modified independent Equipment used: Rolling walker (2 wheels), None                    Ambulation/Gait Ambulation/Gait assistance: Modified independent (Device/Increase time), Supervision Gait Distance (Feet): 150 Feet Assistive device: Rolling walker (2 wheels), None Gait Pattern/deviations: Step-through pattern, Decreased stride length Gait Speed: Pace WFL General Gait Details: Steady gait with walker. Supervision for amb without assistive device due to slight unsteadiness  Home Activity Instructions    Stairs            Modified Rankin (Stroke Patients Only)        Balance Overall balance assessment: Mild deficits observed, not formally tested                        Pertinent Vitals/Pain PT - Brief Vital  Signs All Vital Signs Stable: Yes Pain Assessment Pain Assessment: No/denies pain     Home Living Family/patient expects to be discharged to:: Private residence Living Arrangements: Spouse/significant other Available Help at Discharge: Family Home Environment: Stairs in home   Home Equipment: Agricultural consultant (2 wheels);Cane - single point   Additional Comments: Lives on main level of home    Prior Function Level of Independence: Independent with assistive device(s) Comments: Using cane intermittently    UE/LE Assessment   UE ROM/Strength/Tone/Coordination: WFL    LE ROM/Strength/Tone/Coordination: Impaired LE ROM/Strength/Tone/Coordination Deficits: Slightly decr strength and ROM for rt knee s/p recent rt TKR    Communication   Communication Communication: No apparent difficulties     Cognition Overall Cognitive Status: Appears within functional limits for tasks assessed/performed       General Comments      Exercises Total Joint Exercises Quad Sets: Strengthening, Right, 10 reps, Supine Heel Slides: AROM, Right, 10 reps, Supine Straight Leg Raises: AROM, Right, 10 reps, Supine   Assessment/Plan    PT Problem List Decreased range of motion;Decreased strength;Decreased mobility       PT Visit Diagnosis Other abnormalities of gait and mobility (R26.89)    No Skilled PT     Co-evaluation                AMPAC 6 Clicks Help needed turning from your back to your side while in a flat bed without using bedrails?: None Help  needed moving from lying on your back to sitting on the side of a flat bed without using bedrails?: None Help needed moving to and from a bed to a chair (including a wheelchair)?: None Help needed standing up from a chair using your arms (e.g., wheelchair or bedside chair)?: None Help needed to walk in hospital room?: None Help needed climbing 3-5 steps with a railing? : A Little 6 Click Score: 23      End of Session   Activity  Tolerance: Patient tolerated treatment well Patient left: in bed;with call bell/phone within reach Nurse Communication: Mobility status PT Visit Diagnosis: Other abnormalities of gait and mobility (R26.89)     Time: 2536-6440 PT Time Calculation (min) (ACUTE ONLY): 25 min  Charges:   PT Evaluation $PT Eval Low Complexity: 1 Low PT Treatments $Gait Training: 8-22 mins    [facility] PT Acute Rehabilitation Services Office [phone]   Angelina Ok [facility]  07/01/2023, 3:05 PM

## 2023-07-02 DIAGNOSIS — R001 Bradycardia, unspecified: Secondary | ICD-10-CM | POA: Diagnosis not present

## 2023-07-02 LAB — BASIC METABOLIC PANEL
Anion gap: 7 (ref 5–15)
BUN: 14 mg/dL (ref 8–23)
CO2: 27 mmol/L (ref 22–32)
Calcium: 8.6 mg/dL — ABNORMAL LOW (ref 8.9–10.3)
Chloride: 100 mmol/L (ref 98–111)
Creatinine, Ser: 1.13 mg/dL (ref 0.61–1.24)
GFR, Estimated: >60 mL/min (ref >60)
Glucose, Bld: 92 mg/dL (ref 70–99)
Potassium: 3.9 mmol/L (ref 3.5–5.1)
Sodium: 134 mmol/L — ABNORMAL LOW (ref 135–145)

## 2023-07-02 LAB — RENAL FUNCTION PANEL
Albumin: 3 g/dL — ABNORMAL LOW (ref 3.5–5.0)
Anion gap: 10 (ref 5–15)
BUN: 14 mg/dL (ref 8–23)
CO2: 26 mmol/L (ref 22–32)
Calcium: 8.7 mg/dL — ABNORMAL LOW (ref 8.9–10.3)
Chloride: 99 mmol/L (ref 98–111)
Creatinine, Ser: 1.23 mg/dL (ref 0.61–1.24)
GFR, Estimated: 59 mL/min — ABNORMAL LOW (ref >60)
Glucose, Bld: 93 mg/dL (ref 70–99)
Phosphorus: 3.1 mg/dL (ref 2.5–4.6)
Potassium: 3.9 mmol/L (ref 3.5–5.1)
Sodium: 135 mmol/L (ref 135–145)

## 2023-07-02 LAB — GLUCOSE, CAPILLARY
Glucose-Capillary: 101 mg/dL — ABNORMAL HIGH (ref 70–99)
Glucose-Capillary: 96 mg/dL (ref 70–99)

## 2023-07-02 NOTE — Progress Notes (Signed)
Patient provided discharge education regarding medications, follow up visits, and when to contact provider. All lines removed and pressure held until hemostasis. All patient questions answered until no further questions. Copy of discharge instructions provided to patient. Last set of vitals taken before patient was removed from all monitors. Patient dressed, retrieved all personal items and was transported to personal vehicle via wheelchair with no incidents.   Temp: 98.8 HR: 72 NSR BP: 164/84 O2 saturation: 92 Respiratory rate: 17 Pain: 0/10, no pain  Ferol Luz RN 07/02/23 1120

## 2023-07-02 NOTE — TOC Transition Note (Signed)
Transition of Care [facility]) - CM/SW Discharge Note   Patient Details  Name: Lucas Davis MRN: [id number] Date of Birth: [birth year]  [facility] CM/SW Contact:  Lawerance Sabal, RN Phone Number: 07/02/2023, 7:34 AM   Clinical Narrative:     Patient will follow with OP PT currently active with through [place].         Patient Goals and CMS Choice      Discharge Placement                         Discharge Plan and Services Additional resources added to the After Visit Summary for                                       Social Determinants of Health (SDOH) Interventions SDOH Screenings   Housing: Patient Unable To Answer (06/29/2023)  Transportation Needs: Patient Unable To Answer (06/29/2023)  Utilities: At Risk (06/29/2023)  Depression (PHQ2-9): Low Risk  (05/01/2021)  Tobacco Use: Low Risk  (06/29/2023)     Readmission Risk Interventions     No data to display

## 2023-07-02 NOTE — Discharge Instructions (Addendum)
Read the medications to stop above See your PCP within 2-3 weeks of discharge for a blood pressure check

## 2023-07-02 NOTE — Plan of Care (Signed)

## 2023-07-02 NOTE — Progress Notes (Signed)
 PICC line removed per order and with no complications.  Pressure held to achieve hemostasis.  Vaseline/gauze/tegaderm applied.  Aftercare instructions reviewed with pt who verbalized understanding.

## 2023-09-07 ENCOUNTER — Encounter (HOSPITAL_COMMUNITY): Payer: Self-pay | Admitting: Pharmacy Technician

## 2023-09-07 ENCOUNTER — Other Ambulatory Visit: Payer: Self-pay

## 2023-09-07 ENCOUNTER — Inpatient Hospital Stay (HOSPITAL_COMMUNITY)
Admission: EM | Admit: 2023-09-07 | Discharge: 2023-09-11 | DRG: 286 | Disposition: A | Discharge status: Home / Self Care | Payer: No Typology Code available for payment source | Attending: Internal Medicine | Admitting: Internal Medicine

## 2023-09-07 ENCOUNTER — Telehealth: Payer: Self-pay | Admitting: Cardiology

## 2023-09-07 ENCOUNTER — Emergency Department (HOSPITAL_COMMUNITY): Payer: No Typology Code available for payment source

## 2023-09-07 DIAGNOSIS — J9601 Acute respiratory failure with hypoxia: Secondary | ICD-10-CM | POA: Diagnosis not present

## 2023-09-07 DIAGNOSIS — E1140 Type 2 diabetes mellitus with diabetic neuropathy, unspecified: Secondary | ICD-10-CM | POA: Diagnosis present

## 2023-09-07 DIAGNOSIS — E785 Hyperlipidemia, unspecified: Secondary | ICD-10-CM | POA: Diagnosis present

## 2023-09-07 DIAGNOSIS — M1990 Unspecified osteoarthritis, unspecified site: Secondary | ICD-10-CM | POA: Diagnosis present

## 2023-09-07 DIAGNOSIS — H25812 Combined forms of age-related cataract, left eye: Secondary | ICD-10-CM | POA: Insufficient documentation

## 2023-09-07 DIAGNOSIS — I2489 Other forms of acute ischemic heart disease: Secondary | ICD-10-CM | POA: Diagnosis present

## 2023-09-07 DIAGNOSIS — M069 Rheumatoid arthritis, unspecified: Secondary | ICD-10-CM | POA: Diagnosis present

## 2023-09-07 DIAGNOSIS — Z8249 Family history of ischemic heart disease and other diseases of the circulatory system: Secondary | ICD-10-CM

## 2023-09-07 DIAGNOSIS — Z96651 Presence of right artificial knee joint: Secondary | ICD-10-CM | POA: Insufficient documentation

## 2023-09-07 DIAGNOSIS — G603 Idiopathic progressive neuropathy: Secondary | ICD-10-CM | POA: Insufficient documentation

## 2023-09-07 DIAGNOSIS — I5043 Acute on chronic combined systolic (congestive) and diastolic (congestive) heart failure: Secondary | ICD-10-CM | POA: Diagnosis present

## 2023-09-07 DIAGNOSIS — Z79899 Other long term (current) drug therapy: Secondary | ICD-10-CM

## 2023-09-07 DIAGNOSIS — Z885 Allergy status to narcotic agent status: Secondary | ICD-10-CM

## 2023-09-07 DIAGNOSIS — Z8616 Personal history of COVID-19: Secondary | ICD-10-CM

## 2023-09-07 DIAGNOSIS — I5041 Acute combined systolic (congestive) and diastolic (congestive) heart failure: Secondary | ICD-10-CM

## 2023-09-07 DIAGNOSIS — I440 Atrioventricular block, first degree: Secondary | ICD-10-CM | POA: Diagnosis present

## 2023-09-07 DIAGNOSIS — H903 Sensorineural hearing loss, bilateral: Secondary | ICD-10-CM | POA: Diagnosis not present

## 2023-09-07 DIAGNOSIS — M1991 Primary osteoarthritis, unspecified site: Secondary | ICD-10-CM | POA: Insufficient documentation

## 2023-09-07 DIAGNOSIS — R0602 Shortness of breath: Secondary | ICD-10-CM | POA: Diagnosis not present

## 2023-09-07 DIAGNOSIS — I509 Heart failure, unspecified: Principal | ICD-10-CM

## 2023-09-07 DIAGNOSIS — K219 Gastro-esophageal reflux disease without esophagitis: Secondary | ICD-10-CM | POA: Diagnosis present

## 2023-09-07 DIAGNOSIS — E119 Type 2 diabetes mellitus without complications: Secondary | ICD-10-CM

## 2023-09-07 DIAGNOSIS — I10 Essential (primary) hypertension: Secondary | ICD-10-CM | POA: Diagnosis not present

## 2023-09-07 DIAGNOSIS — R7989 Other specified abnormal findings of blood chemistry: Secondary | ICD-10-CM | POA: Diagnosis not present

## 2023-09-07 DIAGNOSIS — I2510 Atherosclerotic heart disease of native coronary artery without angina pectoris: Secondary | ICD-10-CM | POA: Diagnosis present

## 2023-09-07 DIAGNOSIS — Z888 Allergy status to other drugs, medicaments and biological substances status: Secondary | ICD-10-CM

## 2023-09-07 DIAGNOSIS — Z7982 Long term (current) use of aspirin: Secondary | ICD-10-CM

## 2023-09-07 DIAGNOSIS — K760 Fatty (change of) liver, not elsewhere classified: Secondary | ICD-10-CM | POA: Diagnosis present

## 2023-09-07 DIAGNOSIS — R001 Bradycardia, unspecified: Secondary | ICD-10-CM | POA: Diagnosis present

## 2023-09-07 DIAGNOSIS — Z860100 Personal history of colon polyps, unspecified: Secondary | ICD-10-CM

## 2023-09-07 DIAGNOSIS — Z8546 Personal history of malignant neoplasm of prostate: Secondary | ICD-10-CM

## 2023-09-07 DIAGNOSIS — I110 Hypertensive heart disease with heart failure: Secondary | ICD-10-CM | POA: Diagnosis not present

## 2023-09-07 DIAGNOSIS — I3139 Other pericardial effusion (noninflammatory): Secondary | ICD-10-CM | POA: Diagnosis present

## 2023-09-07 DIAGNOSIS — G250 Essential tremor: Secondary | ICD-10-CM | POA: Insufficient documentation

## 2023-09-07 DIAGNOSIS — I5033 Acute on chronic diastolic (congestive) heart failure: Secondary | ICD-10-CM | POA: Diagnosis not present

## 2023-09-07 DIAGNOSIS — Z794 Long term (current) use of insulin: Secondary | ICD-10-CM

## 2023-09-07 DIAGNOSIS — Z1152 Encounter for screening for COVID-19: Secondary | ICD-10-CM

## 2023-09-07 HISTORY — DX: Type 2 diabetes mellitus without complications: E11.9

## 2023-09-07 LAB — CBC WITH DIFFERENTIAL/PLATELET
Abs Immature Granulocytes: 0.01 10*3/uL (ref 0.00–0.07)
Basophils Absolute: 0 10*3/uL (ref 0.0–0.1)
Basophils Relative: 0 %
Eosinophils Absolute: 0 10*3/uL (ref 0.0–0.5)
Eosinophils Relative: 1 %
HCT: 42.9 % (ref 39.0–52.0)
Hemoglobin: 13.7 g/dL (ref 13.0–17.0)
Immature Granulocytes: 0 %
Lymphocytes Relative: 28 %
Lymphs Abs: 2.1 10*3/uL (ref 0.7–4.0)
MCH: 28.6 pg (ref 26.0–34.0)
MCHC: 31.9 g/dL (ref 30.0–36.0)
MCV: 89.6 fL (ref 80.0–100.0)
Monocytes Absolute: 0.8 10*3/uL (ref 0.1–1.0)
Monocytes Relative: 11 %
Neutro Abs: 4.5 10*3/uL (ref 1.7–7.7)
Neutrophils Relative %: 60 %
Platelets: 271 10*3/uL (ref 150–400)
RBC: 4.79 MIL/uL (ref 4.22–5.81)
RDW: 14.8 % (ref 11.5–15.5)
WBC: 7.5 10*3/uL (ref 4.0–10.5)
nRBC: 0 % (ref 0.0–0.2)

## 2023-09-07 LAB — RESP PANEL BY RT-PCR (RSV, FLU A&B, COVID)  RVPGX2
Influenza A by PCR: NEGATIVE
Influenza B by PCR: NEGATIVE
Resp Syncytial Virus by PCR: NEGATIVE
SARS Coronavirus 2 by RT PCR: NEGATIVE

## 2023-09-07 LAB — COMPREHENSIVE METABOLIC PANEL
ALT: 29 U/L (ref 0–44)
AST: 32 U/L (ref 15–41)
Albumin: 3.6 g/dL (ref 3.5–5.0)
Alkaline Phosphatase: 61 U/L (ref 38–126)
Anion gap: 12 (ref 5–15)
BUN: 27 mg/dL — ABNORMAL HIGH (ref 8–23)
CO2: 24 mmol/L (ref 22–32)
Calcium: 9.3 mg/dL (ref 8.9–10.3)
Chloride: 100 mmol/L (ref 98–111)
Creatinine, Ser: 1.1 mg/dL (ref 0.61–1.24)
GFR, Estimated: >60 mL/min (ref >60)
Glucose, Bld: 115 mg/dL — ABNORMAL HIGH (ref 70–99)
Potassium: 4 mmol/L (ref 3.5–5.1)
Sodium: 136 mmol/L (ref 135–145)
Total Bilirubin: 1.5 mg/dL — ABNORMAL HIGH (ref <1.2)
Total Protein: 7 g/dL (ref 6.5–8.1)

## 2023-09-07 LAB — I-STAT VENOUS BLOOD GAS, ED
Acid-base deficit: 2 mmol/L (ref 0.0–2.0)
Bicarbonate: 22.7 mmol/L (ref 20.0–28.0)
Calcium, Ion: 1.12 mmol/L — ABNORMAL LOW (ref 1.15–1.40)
HCT: 42 % (ref 39.0–52.0)
Hemoglobin: 14.3 g/dL (ref 13.0–17.0)
O2 Saturation: 51 %
Potassium: 4.2 mmol/L (ref 3.5–5.1)
Sodium: 137 mmol/L (ref 135–145)
TCO2: 24 mmol/L (ref 22–32)
pCO2, Ven: 37 mm[Hg] — ABNORMAL LOW (ref 44–60)
pH, Ven: 7.395 (ref 7.25–7.43)
pO2, Ven: 27 mm[Hg] — CL (ref 32–45)

## 2023-09-07 LAB — D-DIMER, QUANTITATIVE: D-Dimer, Quant: 0.56 ug{FEU}/mL — ABNORMAL HIGH (ref 0.00–0.50)

## 2023-09-07 LAB — TROPONIN I (HIGH SENSITIVITY)
Troponin I (High Sensitivity): 120 ng/L (ref <18)
Troponin I (High Sensitivity): 91 ng/L — ABNORMAL HIGH (ref <18)

## 2023-09-07 LAB — MAGNESIUM: Magnesium: 2 mg/dL (ref 1.7–2.4)

## 2023-09-07 LAB — BRAIN NATRIURETIC PEPTIDE: B Natriuretic Peptide: 2474.7 pg/mL — ABNORMAL HIGH (ref 0.0–100.0)

## 2023-09-07 LAB — CBG MONITORING, ED: Glucose-Capillary: 134 mg/dL — ABNORMAL HIGH (ref 70–99)

## 2023-09-07 MED ORDER — ENOXAPARIN SODIUM 40 MG/0.4ML IJ SOSY
40.0000 mg | PREFILLED_SYRINGE | INTRAMUSCULAR | Status: DC
Start: 2023-09-07 — End: 2023-09-09
  Administered 2023-09-07 – 2023-09-08 (×2): 40 mg via SUBCUTANEOUS
  Filled 2023-09-07 (×2): qty 0.4

## 2023-09-07 MED ORDER — PROPRANOLOL HCL 10 MG PO TABS: 20.0000 mg | ORAL_TABLET | Freq: Three times a day (TID) | ORAL | Status: DC

## 2023-09-07 MED ORDER — ACETAMINOPHEN 650 MG RE SUPP: 650.0000 mg | Freq: Four times a day (QID) | RECTAL | Status: DC | PRN

## 2023-09-07 MED ORDER — POLYETHYLENE GLYCOL 3350 17 G PO PACK
17.0000 g | PACK | Freq: Every day | ORAL | Status: DC | PRN
Start: 2023-09-07 — End: 2023-09-10

## 2023-09-07 MED ORDER — FUROSEMIDE 10 MG/ML IJ SOLN
40.0000 mg | Freq: Once | INTRAMUSCULAR | Status: AC
  Administered 2023-09-07: 40 mg via INTRAVENOUS
  Filled 2023-09-07: qty 4

## 2023-09-07 MED ORDER — CARBOXYMETHYLCELLULOSE SODIUM 0.5 % OP SOLN: 1.0000 [drp] | Freq: Two times a day (BID) | OPHTHALMIC | Status: DC

## 2023-09-07 MED ORDER — ACETAMINOPHEN 325 MG PO TABS: 650.0000 mg | ORAL_TABLET | Freq: Four times a day (QID) | ORAL | Status: DC | PRN

## 2023-09-07 MED ORDER — LOSARTAN POTASSIUM 50 MG PO TABS: 100.0000 mg | ORAL_TABLET | Freq: Every day | ORAL | Status: DC

## 2023-09-07 MED ORDER — PRAVASTATIN SODIUM 40 MG PO TABS
40.0000 mg | ORAL_TABLET | Freq: Every evening | ORAL | Status: DC
  Administered 2023-09-08 – 2023-09-09 (×2): 40 mg via ORAL
  Filled 2023-09-07 (×2): qty 1

## 2023-09-07 MED ORDER — PANTOPRAZOLE SODIUM 40 MG PO TBEC
40.0000 mg | DELAYED_RELEASE_TABLET | Freq: Every day | ORAL | Status: DC
  Administered 2023-09-08 – 2023-09-11 (×4): 40 mg via ORAL
  Filled 2023-09-07 (×4): qty 1

## 2023-09-07 MED ORDER — INSULIN ASPART 100 UNIT/ML IJ SOLN
0.0000 [IU] | Freq: Three times a day (TID) | INTRAMUSCULAR | Status: DC
Start: 2023-09-07 — End: 2023-09-11
  Administered 2023-09-10: 1 [IU] via SUBCUTANEOUS

## 2023-09-07 MED ORDER — ALBUTEROL SULFATE HFA 108 (90 BASE) MCG/ACT IN AERS: 1.0000 | INHALATION_SPRAY | Freq: Once | RESPIRATORY_TRACT | Status: DC

## 2023-09-07 MED ORDER — FUROSEMIDE 10 MG/ML IJ SOLN
40.0000 mg | Freq: Two times a day (BID) | INTRAMUSCULAR | Status: DC
Start: 2023-09-08 — End: 2023-09-08

## 2023-09-07 MED ORDER — PREGABALIN 25 MG PO CAPS: 50.0000 mg | ORAL_CAPSULE | Freq: Three times a day (TID) | ORAL | Status: DC

## 2023-09-07 MED ORDER — OMEPRAZOLE MAGNESIUM 20 MG PO TBEC: 20.0000 mg | DELAYED_RELEASE_TABLET | Freq: Every day | ORAL | Status: DC

## 2023-09-07 MED ORDER — ASPIRIN 325 MG PO TABS: 162.5000 mg | ORAL_TABLET | Freq: Two times a day (BID) | ORAL | Status: DC

## 2023-09-07 MED ORDER — SODIUM CHLORIDE 0.9% FLUSH
3.0000 mL | Freq: Two times a day (BID) | INTRAVENOUS | Status: DC
  Administered 2023-09-08 – 2023-09-10 (×5): 3 mL via INTRAVENOUS

## 2023-09-07 MED ORDER — IPRATROPIUM-ALBUTEROL 0.5-2.5 (3) MG/3ML IN SOLN
3.0000 mL | Freq: Once | RESPIRATORY_TRACT | Status: AC
  Administered 2023-09-07: 3 mL via RESPIRATORY_TRACT
  Filled 2023-09-07: qty 3

## 2023-09-07 NOTE — ED Triage Notes (Signed)
Pt here with reports of generalized weakness and dizziness intermittently. States over the last two weeks increased shob and work of breathing. RR in triage in the 40's. Pt HR 41. States hx of bradycardia but they thought it was due to medications he was taking so he has not been on them for a while.

## 2023-09-07 NOTE — ED Provider Triage Note (Signed)
Emergency Medicine Provider Triage Evaluation Note  Lucas Davis , a 82 y.o. male  was evaluated in triage.  Pt complains of SOB.  Patient reports he was in the ICU for shortness of breath has been feeling better after being discharged but for the past 2 weeks has been having worsening dyspnea.  The shortness of breath happens both at rest and with exercise.  Patient's wife for started noticing it around nighttime.  Does not take any medication for his lungs.  Review of Systems  Positive: Shortness of breath Negative: Chest pain  Physical Exam  BP (!) 146/102 (BP Location: Right Arm)   Pulse (!) 41   Temp 98.2 F (36.8 C)   Resp (!) 40   SpO2 95%  Gen:   Awake, no distress   Resp:  Increased respiratory effort.  Has trouble getting a full sentence out. MSK:   Moves extremities without difficulty  Other:    Medical Decision Making  Medically screening exam initiated at 1:32 PM.  Appropriate orders placed.  Lucas Davis was informed that the remainder of the evaluation will be completed by another provider, this initial triage assessment does not replace that evaluation, and the importance of remaining in the ED until their evaluation is complete.   Maxwell Marion, PA-C 09/07/23 1337

## 2023-09-07 NOTE — H&P (Signed)
History and Physical   Lucas Davis ZOX:[id number] DOB: 06-09-1941 DOA: 09/07/2023  PCP: [facility]   Patient coming from: Home  Chief Complaint: Shortness of breath  HPI: Lucas Davis is a 82 y.o. male with medical history significant of hypertension, hyperlipidemia, diabetes, hearing loss, rheumatoid arthritis, osteoarthritis, fatty liver, neuropathy, essential tremor, cataracts presenting with respiratory distress and weakness.  Patient reporting 2 to 3 weeks of shortness of breath that has been intermittent but gradually worsening.  Significantly worse the last couple days.  Now with orthopnea and cough which is worse at night.  Has associated worsening lower extremity edema.  Denies fevers, chills, chest pain, abdominal pain, constipation, diarrhea, nausea, vomiting.  ED Course: Vital signs in the ED notable for blood pressure in the 130s to 140 systolic.  Respiratory rate initially as high as the 30s to 40s now improving to the 20s on BiPAP was hypoxic as well.  Lab workup included CMP with BUN 27, glucose 115, T. bili 1.5.  CBC within normal limits.  Magnesium normal.  Troponin 120 with repeat pending.  BNP greater than 2400.  D-dimer age-adjusted normal at 0.56.  Respiratory panel for flu COVID and RSV negative.  VBG with normal pH and pCO2 of 38.  Placed on BiPAP as above, also received Lasix, DuoNeb.  Cardiology consulted and will see the patient.  Chest x-ray pending.  Review of Systems: As per HPI otherwise all other systems reviewed and are negative.  Past Medical History:  Diagnosis Date   Arthritis    Bilateral impacted cerumen 09/07/2016   COVID-19 06/30/2023   GERD (gastroesophageal reflux disease)    Hyperlipidemia    Hypertension    Neuropathy    Prostate cancer (HCC)    Type 2 diabetes mellitus (HCC) 09/07/2023    Past Surgical History:  Procedure Laterality Date   COLONOSCOPY  2006,2012   hx colon polyps   POLYPECTOMY      Social  History  reports that he has never smoked. He has never used smokeless tobacco. He reports current alcohol use. He reports that he does not use drugs.  Allergies  Allergen Reactions   Beta Adrenergic Blockers     Symptomatic bradycardia but may have overdose, ?   Oxycontin [Oxycodone] Other (See Comments)    Hallucinations  Severe drowsiness    Family History  Problem Relation Age of Onset   Heart disease Father 4   Colon cancer Neg Hx    Stomach cancer Neg Hx   Reviewed on admission  Prior to Admission medications   Medication Sig Start Date End Date Taking? Authorizing Provider  aspirin 325 MG tablet Take 162.5 mg by mouth 2 (two) times daily.    [provider]  bisacodyl (DULCOLAX) 5 MG EC tablet Take 5 mg by mouth daily as needed for severe constipation.    [provider]  carboxymethylcellulose (REFRESH PLUS) 0.5 % SOLN Place 1 drop into both eyes 2 (two) times daily.    [provider]  celecoxib (CELEBREX) 200 MG capsule Take 200 mg by mouth 2 (two) times daily.    [provider]  Coenzyme Q10 (COQ10 PO) Take 1 capsule by mouth daily.    [provider]  cyclobenzaprine (FLEXERIL) 10 MG tablet Take 5 mg by mouth every 8 (eight) hours as needed for muscle spasms.    [provider]  folic acid (FOLVITE) 1 MG tablet Take 1 mg by mouth daily. 09/05/19   [provider]  losartan-hydrochlorothiazide (HYZAAR) 100-25 MG tablet Take 1 tablet by mouth daily. 06/23/23   [provider]  Multiple Vitamin (MULTIVITAMIN) tablet Take 1 tablet by mouth daily.    [provider]  Omega-3 Fatty Acids (FISH OIL) 1000 MG CAPS Take 1,000 mg by mouth 2 (two) times daily.    [provider]  omeprazole (PRILOSEC OTC) 20 MG tablet Take 20 mg by mouth daily.    [provider]  pravastatin (PRAVACHOL) 40 MG tablet Take 40 mg by mouth every evening.    [provider]  pregabalin (LYRICA) 50  MG capsule Take 50 mg by mouth 3 (three) times daily. 03/14/23   [provider]  propranolol (INDERAL) 20 MG tablet Take 20 mg by mouth 3 (three) times daily. 03/14/23   [provider]    Physical Exam: Vitals:   09/07/23 1252 09/07/23 1306 09/07/23 1415 09/07/23 1419  BP: (!) 146/102  (!) 139/100   Pulse: (!) 41  79   Resp: 18 (!) 40 (!) 36   Temp: 98.2 F (36.8 C)     SpO2: 95%  91%   Weight:    86.2 kg  Height:    5\' 7"  (1.702 m)    Physical Exam Constitutional:      General: He is not in acute distress.    Appearance: Normal appearance.  HENT:     Head: Normocephalic and atraumatic.     Mouth/Throat:     Mouth: Mucous membranes are moist.     Pharynx: Oropharynx is clear.  Eyes:     Extraocular Movements: Extraocular movements intact.     Pupils: Pupils are equal, round, and reactive to light.  Cardiovascular:     Rate and Rhythm: Normal rate and regular rhythm.     Pulses: Normal pulses.     Heart sounds: Normal heart sounds.  Pulmonary:     Effort: Pulmonary effort is normal. No respiratory distress.     Breath sounds: Normal breath sounds. No rales.  Abdominal:     General: Bowel sounds are normal. There is no distension.     Palpations: Abdomen is soft.     Tenderness: There is no abdominal tenderness.  Musculoskeletal:        General: No swelling or deformity.     Right lower leg: Edema present.     Left lower leg: Edema present.  Skin:    General: Skin is warm and dry.  Neurological:     General: No focal deficit present.     Mental Status: Mental status is at baseline.    Labs on Admission: I have personally reviewed following labs and imaging studies  CBC: Recent Labs  Lab 09/07/23 1328 09/07/23 1629  WBC 7.5  --   NEUTROABS 4.5  --   HGB 13.7 14.3  HCT 42.9 42.0  MCV 89.6  --   PLT 271  --     Basic Metabolic Panel: Recent Labs  Lab 09/07/23 1328 09/07/23 1629  NA 136 137  K 4.0 4.2  CL 100  --   CO2 24  --    GLUCOSE 115*  --   BUN 27*  --   CREATININE 1.10  --   CALCIUM 9.3  --   MG 2.0  --     GFR: Estimated Creatinine Clearance: 54.3 mL/min (by C-G formula based on SCr of 1.1 mg/dL).  Liver Function Tests: Recent Labs  Lab 09/07/23 1328  AST 32  ALT 29  ALKPHOS  61  BILITOT 1.5*  PROT 7.0  ALBUMIN 3.6    Urine analysis: No results found for: "COLORURINE", "APPEARANCEUR", "LABSPEC", "PHURINE", "GLUCOSEU", "HGBUR", "BILIRUBINUR", "KETONESUR", "PROTEINUR", "UROBILINOGEN", "NITRITE", "LEUKOCYTESUR"  Radiological Exams on Admission: No results found.  EKG: Independently reviewed.  Sinus rhythm who is with PVCs.  Borderline first-degree AV block also noted.  Nonspecific T wave flattening.  Low voltage multiple leads.  Nonspecific intraventricular conduction delay with QRS 128.  Assessment/Plan Principal Problem:   Acute respiratory failure with hypoxia (HCC) Active Problems:   Sensorineural hearing loss (SNHL), bilateral   Essential hypertension   Dyslipidemia   Type 2 diabetes mellitus (HCC)   Acute respiratory failure with hypoxia New acute heart failure > Patient presenting with worsening dyspnea especially on exertion, orthopnea, edema. > Chest x-ray pending.  BNP greater than 2400.  Troponin 120, repeat pending.  Magnesium normal. > Consistent with acute heart failure.  Did have echo when admitted for symptomatic bradycardia over the summer with grade 2 diastolic dysfunction, LVEF 55-60%, normal RV function. > Cardiology consulted in the ED.  Patient received 40 mg IV Lasix and DuoNeb treatment. - Monitor on progressive unit overnight - Appreciate cardiology recommendations - Continue with BiPAP, wean as tolerated - Lasix 40 mg IV twice daily - Strict I's and O's, daily weights - Repeat echocardiogram - Trend renal function and electrolytes - Follow-up repeat troponin  Hypertension > Continue with losartan component of home accommodation medication but hold  hydrochlorothiazide component considering being started on the Lasix as above.  Hyperlipidemia - Continue home pravastatin  Diabetes - SSI  Neuropathy - Continue home Lyrica  Essential tremor - Propranolol was discontinued after admission in the summer for symptomatic bradycardia for which he required ICU admission and dopamine infusion.  GERD - Continue PPI  DVT prophylaxis: Lovenox Code Status:   Full Family Communication:  None on admission  Disposition Plan:   Patient is from:  Home  Anticipated DC to:  Home  Anticipated DC date:  1 to 4 days  Anticipated DC barriers: None  Consults called:  Cardiology Admission status:  Observation, progressive  Severity of Illness: The appropriate patient status for this patient is OBSERVATION. Observation status is judged to be reasonable and necessary in order to provide the required intensity of service to ensure the patient's safety. The patient's presenting symptoms, physical exam findings, and initial radiographic and laboratory data in the context of their medical condition is felt to place them at decreased risk for further clinical deterioration. Furthermore, it is anticipated that the patient will be medically stable for discharge from the hospital within 2 midnights of admission.    Synetta Fail MD [facility]  How to contact the [facility] Attending or Consulting provider 7A - 7P or covering provider during after hours 7P -7A, for this patient?   Check the care team in [facility] and look for a) attending/consulting [facility] provider listed and b) the [facility] team listed Log into [email or web address] and use [place]'s universal password to access. If you do not have the password, please contact the hospital operator. Locate the [facility] provider you are looking for under [facility] and page to a number that you can be directly reached. If you still have difficulty reaching the provider, please page the [facility] (Director on Call) for the  Hospitalists listed on amion for assistance. 09/07/2023, 4:46 PM
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
## 2023-09-07 NOTE — ED Provider Notes (Signed)
  Physical Exam  BP (!) 139/100   Pulse 79   Temp 98.2 F (36.8 C)   Resp (!) 36   Ht 5\' 7"  (1.702 m)   Wt 86.2 kg   SpO2 91%   BMI 29.76 kg/m   Physical Exam  Procedures  Procedures  ED Course / MDM   Clinical Course as of 09/07/23 1624  Wed Sep 07, 2023  1603 B Natriuretic Peptide(!): 2,474.7 Significantly elevated pro-BNP [HN]  1603 D-Dimer, Quant(!): 0.56 Negative age-adjusted d-dimer [HN]  1608 CBC with Differential wnl [HN]  1608 Comprehensive metabolic panel(!) Unremarkable in the context of this patient's presentation  [HN]  1608 Resp panel by RT-PCR (RSV, Flu A&B, Covid) Anterior Nasal Swab neg [HN]  1608 Troponin I (High Sensitivity)(!!): 120 Elevated trop, will need cards and trend for possible NSTEMI [HN]    Clinical Course User Index [HN] Loetta Rough, MD   Medical Decision Making Care assumed at 3:30 PM.  Patient has a history of bradycardia that required dopamine infusion, heart failure here presenting with shortness of breath and orthopnea.  Signed out pending BNP and admission.  4:24 PM BNP is 2400.  Troponin is 120.  Patient is diaphoretic and started patient on BiPAP.  Consulted cardiology to see patient.  Patient will be admitted by medicine service for respiratory distress secondary to heart failure exacerbation.  Of note patient's D-dimer was 0.56 but normal if age-adjusted so CTA was canceled by previous provider   4:43 PM Consulted cardiology to see patient.   CRITICAL CARE Performed by: Richardean Canal   Total critical care time: 37 minutes  Critical care time was exclusive of separately billable procedures and treating other patients.  Critical care was necessary to treat or prevent imminent or life-threatening deterioration.  Critical care was time spent personally by me on the following activities: development of treatment plan with patient and/or surrogate as well as nursing, discussions with consultants, evaluation of patient's  response to treatment, examination of patient, obtaining history from patient or surrogate, ordering and performing treatments and interventions, ordering and review of laboratory studies, ordering and review of radiographic studies, pulse oximetry and re-evaluation of patient's condition.   Amount and/or Complexity of Data Reviewed Labs: ordered. Decision-making details documented in ED Course. Radiology: ordered.  Risk Prescription drug management.          Charlynne Pander, MD 09/07/23 [phone]

## 2023-09-07 NOTE — ED Provider Notes (Signed)
[place] EMERGENCY DEPARTMENT AT [facility] Provider Note   CSN: [id number] Arrival date & time: 09/07/23  1241     History  Chief Complaint  Patient presents with   Respiratory Distress   Weakness    Lucas Davis is a 82 y.o. male with w/ HOH, DM, HTN, HLD, fatty liver, HOH, RA, OA, and GERD who obtains his care at the [place]. Presents with SOB x 2-3 weeks, intermittent but worsening. +orthopnea. Also w/ cough worse at night, coughing up light yellow phlegm. Denies hemoptysis, f/c, sick contacts. Endorses chest pressure mild. Endorses bilateral LE edema worse than normal. No h/o DVT/PE, recent travel. At end of July 2024 had partial knee replacement, then was admitted to hospital at end of August 2024 for polypharmacy. Endorses wheezing bilaterally but there is none heard now, and patient has no h/o tobacco use or COPD.  Past Medical History:  Diagnosis Date   Arthritis    GERD (gastroesophageal reflux disease)    Hyperlipidemia    Hypertension    Neuropathy    Prostate cancer (HCC)        Home Medications Prior to Admission medications   Medication Sig Start Date End Date Taking? Authorizing Provider  amitriptyline (ELAVIL) 50 MG tablet Take 50 mg by mouth at bedtime.    [provider]  aspirin 325 MG tablet Take 162.5 mg by mouth 2 (two) times daily.    [provider]  bisacodyl (DULCOLAX) 5 MG EC tablet Take 5 mg by mouth daily as needed for severe constipation.    [provider]  carboxymethylcellulose (REFRESH PLUS) 0.5 % SOLN Place 1 drop into both eyes 2 (two) times daily.    [provider]  celecoxib (CELEBREX) 200 MG capsule Take 200 mg by mouth 2 (two) times daily.    [provider]  Coenzyme Q10 (COQ10 PO) Take 1 capsule by mouth daily.    [provider]  cyclobenzaprine (FLEXERIL) 10 MG tablet Take 5 mg by mouth every 8 (eight) hours as needed for muscle spasms.    [provider]   folic acid (FOLVITE) 1 MG tablet Take 1 mg by mouth daily. 09/05/19   [provider]  Multiple Vitamin (MULTIVITAMIN) tablet Take 1 tablet by mouth daily.    [provider]  Omega-3 Fatty Acids (FISH OIL) 1000 MG CAPS Take 1,000 mg by mouth 2 (two) times daily.    [provider]  omeprazole (PRILOSEC OTC) 20 MG tablet Take 20 mg by mouth daily.    [provider]  pravastatin (PRAVACHOL) 40 MG tablet Take 40 mg by mouth every evening.    [provider]      Allergies    Beta adrenergic blockers and Oxycontin [oxycodone]    Review of Systems   Review of Systems A 10 point review of systems was performed and is negative unless otherwise reported in HPI.  Physical Exam Updated Vital Signs BP (!) 139/100   Pulse 79   Temp 98.2 F (36.8 C)   Resp (!) 36   Ht 5\' 7"  (1.702 m)   Wt 86.2 kg   SpO2 91%   BMI 29.76 kg/m  Physical Exam General: Normal appearing male, lying in bed.  HEENT: PERRLA, Sclera anicteric, MMM, trachea midline.  Cardiology: RRR, no murmurs/rubs/gallops. BL radial and DP pulses equal bilaterally.  Resp: Increased respiratory rate into 40s, mild increased resp effort. CTAB, no wheezes, rhonchi, crackles.  Abd: Soft, non-tender, non-distended. No  rebound tenderness or guarding.  GU: Deferred. MSK: No peripheral edema or signs of trauma. Extremities without deformity or TTP. No cyanosis or clubbing. Skin: warm, dry. Neuro: A&Ox4, CNs II-XII grossly intact. MAEs. Sensation grossly intact.  Psych: Normal mood and affect.   ED Results / Procedures / Treatments   Labs (all labs ordered are listed, but only abnormal results are displayed) Labs Reviewed  COMPREHENSIVE METABOLIC PANEL - Abnormal; Notable for the following components:      Result Value   Glucose, Bld 115 (*)    BUN 27 (*)    Total Bilirubin 1.5 (*)    All other components within normal limits  BRAIN NATRIURETIC PEPTIDE - Abnormal; Notable for the  following components:   B Natriuretic Peptide 2,474.7 (*)    All other components within normal limits  D-DIMER, QUANTITATIVE - Abnormal; Notable for the following components:   D-Dimer, Quant 0.56 (*)    All other components within normal limits  TROPONIN I (HIGH SENSITIVITY) - Abnormal; Notable for the following components:   Troponin I (High Sensitivity) 120 (*)    All other components within normal limits  RESP PANEL BY RT-PCR (RSV, FLU A&B, COVID)  [id number]  CBC WITH DIFFERENTIAL/PLATELET  MAGNESIUM  I-STAT VENOUS BLOOD GAS, ED  TROPONIN I (HIGH SENSITIVITY)    EKG EKG Interpretation Date/Time:  Wednesday September 07 2023 12:55:38 EST Ventricular Rate:  92 PR Interval:  218 QRS Duration:  128 QT Interval:  386 QTC Calculation: 477 R Axis:   -12  Text Interpretation: Sinus rhythm with 1st degree A-V block with frequent Premature ventricular complexes Non-specific intra-ventricular conduction block Minimal voltage criteria for LVH, may be normal variant ( Cornell product ) Confirmed by Vivi Barrack [phone]) on 09/07/2023 4:04:24 PM  Radiology No results found.  Procedures .Critical Care  Performed by: Loetta Rough, MD Authorized by: Loetta Rough, MD   Critical care provider statement:    Critical care time (minutes):  35   Critical care was necessary to treat or prevent imminent or life-threatening deterioration of the following conditions:  Cardiac failure   Critical care was time spent personally by me on the following activities:  Development of treatment plan with patient or surrogate, discussions with consultants, evaluation of patient's response to treatment, examination of patient, ordering and review of laboratory studies, ordering and review of radiographic studies, ordering and performing treatments and interventions, pulse oximetry, re-evaluation of patient's condition and review of old charts     Medications Ordered in ED Medications  furosemide (LASIX)  injection 40 mg (has no administration in time range)  ipratropium-albuterol (DUONEB) 0.5-2.5 (3) MG/3ML nebulizer solution 3 mL (3 mLs Nebulization Given 09/07/23 1532)     ED Course/ Medical Decision Making/ A&P                          Medical Decision Making Amount and/or Complexity of Data Reviewed Labs: ordered. Radiology: ordered.  Risk Prescription drug management.    This patient presents to the ED for concern of SOB, this involves an extensive number of treatment options, and is a complaint that carries with it a high risk of complications and morbidity.  I considered the following differential and admission for this acute, potentially life threatening condition.   MDM:    DDX for dyspnea includes but is not limited to:  Consider heart failure exacerbation of the top of differential given patient's report of bilateral lower extremity edema as  well as orthopnea and cough.  He notes that he thinks he was wheezing at home but I do not hear any wheezing here and he has no history of COPD or RAD.  Considered PE with admission in August but he has no asymmetric lower extremity edema and a negative age-adjusted D-dimer, so I believe PE is unlikely.  Also consider viral URI, bronchitis, pneumonia.  He does endorse mild chest pressure.  EKG does not demonstrate any signs of ischemia but will get troponin to rule out ACS.   Clinical Course as of 09/07/23 1609  Wed Sep 07, 2023  1603 B Natriuretic Peptide(!): 2,474.7 Significantly elevated pro-BNP [HN]  1603 D-Dimer, Quant(!): 0.56 Negative age-adjusted d-dimer [HN]  1608 CBC with Differential wnl [HN]  1608 Comprehensive metabolic panel(!) Unremarkable in the context of this patient's presentation  [HN]  1608 Resp panel by RT-PCR (RSV, Flu A&B, Covid) Anterior Nasal Swab neg [HN]  1608 Troponin I (High Sensitivity)(!!): 120 Elevated trop, will need cards and trend for possible NSTEMI [HN]    Clinical Course User Index [HN]  Loetta Rough, MD     Labs: I Ordered, and personally interpreted labs.  The pertinent results include:  those listed above  Imaging Studies ordered: I ordered imaging studies including CXR I independently visualized and interpreted imaging. I agree with the radiologist interpretation  Additional history obtained from chart review, wife at bedside.    Cardiac Monitoring: The patient was maintained on a cardiac monitor.  I personally viewed and interpreted the cardiac monitored which showed an underlying rhythm of: NSR w/ LBBB and ventricular bigeminy  Reevaluation: After the interventions noted above, I reevaluated the patient and found that they have :stayed the same  Social Determinants of Health:  lives independently  Disposition:  Patient is signed out to oncoming EDP Dr. Silverio Lay with plans for diuresis and admission.  Co morbidities that complicate the patient evaluation  Past Medical History:  Diagnosis Date   Arthritis    GERD (gastroesophageal reflux disease)    Hyperlipidemia    Hypertension    Neuropathy    Prostate cancer (HCC)      Medicines Meds ordered this encounter  Medications   DISCONTD: albuterol (VENTOLIN HFA) 108 (90 Base) MCG/ACT inhaler 1 puff   ipratropium-albuterol (DUONEB) 0.5-2.5 (3) MG/3ML nebulizer solution 3 mL   furosemide (LASIX) injection 40 mg    I have reviewed the patients home medicines and have made adjustments as needed  Problem List / ED Course: Problem List Items Addressed This Visit   None Visit Diagnoses     Acute on chronic congestive heart failure, unspecified heart failure type (HCC)    -  Primary   Relevant Medications   furosemide (LASIX) injection 40 mg (Start on 09/07/2023  4:15 PM)   Shortness of breath                       This note was created using dictation software, which may contain spelling or grammatical errors.    Loetta Rough, MD 09/07/23 [phone]

## 2023-09-07 NOTE — Telephone Encounter (Signed)
error 

## 2023-09-08 ENCOUNTER — Observation Stay (HOSPITAL_COMMUNITY): Payer: No Typology Code available for payment source

## 2023-09-08 DIAGNOSIS — R001 Bradycardia, unspecified: Secondary | ICD-10-CM | POA: Diagnosis not present

## 2023-09-08 DIAGNOSIS — G250 Essential tremor: Secondary | ICD-10-CM | POA: Diagnosis present

## 2023-09-08 DIAGNOSIS — K760 Fatty (change of) liver, not elsewhere classified: Secondary | ICD-10-CM | POA: Diagnosis present

## 2023-09-08 DIAGNOSIS — R0602 Shortness of breath: Secondary | ICD-10-CM | POA: Diagnosis present

## 2023-09-08 DIAGNOSIS — Z8249 Family history of ischemic heart disease and other diseases of the circulatory system: Secondary | ICD-10-CM | POA: Diagnosis not present

## 2023-09-08 DIAGNOSIS — I2489 Other forms of acute ischemic heart disease: Secondary | ICD-10-CM

## 2023-09-08 DIAGNOSIS — I110 Hypertensive heart disease with heart failure: Secondary | ICD-10-CM | POA: Diagnosis present

## 2023-09-08 DIAGNOSIS — Z7982 Long term (current) use of aspirin: Secondary | ICD-10-CM | POA: Diagnosis not present

## 2023-09-08 DIAGNOSIS — Z794 Long term (current) use of insulin: Secondary | ICD-10-CM | POA: Diagnosis not present

## 2023-09-08 DIAGNOSIS — E1140 Type 2 diabetes mellitus with diabetic neuropathy, unspecified: Secondary | ICD-10-CM | POA: Diagnosis present

## 2023-09-08 DIAGNOSIS — Z885 Allergy status to narcotic agent status: Secondary | ICD-10-CM | POA: Diagnosis not present

## 2023-09-08 DIAGNOSIS — Z8546 Personal history of malignant neoplasm of prostate: Secondary | ICD-10-CM | POA: Diagnosis not present

## 2023-09-08 DIAGNOSIS — Z1152 Encounter for screening for COVID-19: Secondary | ICD-10-CM | POA: Diagnosis not present

## 2023-09-08 DIAGNOSIS — R0609 Other forms of dyspnea: Secondary | ICD-10-CM

## 2023-09-08 DIAGNOSIS — J9601 Acute respiratory failure with hypoxia: Secondary | ICD-10-CM | POA: Diagnosis present

## 2023-09-08 DIAGNOSIS — I5033 Acute on chronic diastolic (congestive) heart failure: Secondary | ICD-10-CM | POA: Diagnosis present

## 2023-09-08 DIAGNOSIS — Z79899 Other long term (current) drug therapy: Secondary | ICD-10-CM | POA: Diagnosis not present

## 2023-09-08 DIAGNOSIS — I5041 Acute combined systolic (congestive) and diastolic (congestive) heart failure: Secondary | ICD-10-CM | POA: Diagnosis not present

## 2023-09-08 DIAGNOSIS — I509 Heart failure, unspecified: Secondary | ICD-10-CM | POA: Diagnosis not present

## 2023-09-08 DIAGNOSIS — I10 Essential (primary) hypertension: Secondary | ICD-10-CM | POA: Diagnosis not present

## 2023-09-08 DIAGNOSIS — M069 Rheumatoid arthritis, unspecified: Secondary | ICD-10-CM | POA: Diagnosis present

## 2023-09-08 DIAGNOSIS — E119 Type 2 diabetes mellitus without complications: Secondary | ICD-10-CM | POA: Diagnosis not present

## 2023-09-08 DIAGNOSIS — I3139 Other pericardial effusion (noninflammatory): Secondary | ICD-10-CM | POA: Diagnosis present

## 2023-09-08 DIAGNOSIS — Z888 Allergy status to other drugs, medicaments and biological substances status: Secondary | ICD-10-CM | POA: Diagnosis not present

## 2023-09-08 DIAGNOSIS — M1990 Unspecified osteoarthritis, unspecified site: Secondary | ICD-10-CM | POA: Diagnosis present

## 2023-09-08 DIAGNOSIS — I5043 Acute on chronic combined systolic (congestive) and diastolic (congestive) heart failure: Secondary | ICD-10-CM | POA: Diagnosis present

## 2023-09-08 DIAGNOSIS — H903 Sensorineural hearing loss, bilateral: Secondary | ICD-10-CM | POA: Diagnosis present

## 2023-09-08 DIAGNOSIS — E785 Hyperlipidemia, unspecified: Secondary | ICD-10-CM | POA: Diagnosis present

## 2023-09-08 DIAGNOSIS — Z860100 Personal history of colon polyps, unspecified: Secondary | ICD-10-CM | POA: Diagnosis not present

## 2023-09-08 DIAGNOSIS — I2510 Atherosclerotic heart disease of native coronary artery without angina pectoris: Secondary | ICD-10-CM | POA: Diagnosis present

## 2023-09-08 DIAGNOSIS — I5021 Acute systolic (congestive) heart failure: Secondary | ICD-10-CM | POA: Diagnosis not present

## 2023-09-08 DIAGNOSIS — Z8616 Personal history of COVID-19: Secondary | ICD-10-CM | POA: Diagnosis not present

## 2023-09-08 DIAGNOSIS — I440 Atrioventricular block, first degree: Secondary | ICD-10-CM | POA: Diagnosis present

## 2023-09-08 LAB — COMPREHENSIVE METABOLIC PANEL
ALT: 29 U/L (ref 0–44)
AST: 40 U/L (ref 15–41)
Albumin: 3.7 g/dL (ref 3.5–5.0)
Alkaline Phosphatase: 60 U/L (ref 38–126)
Anion gap: 17 — ABNORMAL HIGH (ref 5–15)
BUN: 24 mg/dL — ABNORMAL HIGH (ref 8–23)
CO2: 21 mmol/L — ABNORMAL LOW (ref 22–32)
Calcium: 9.2 mg/dL (ref 8.9–10.3)
Chloride: 101 mmol/L (ref 98–111)
Creatinine, Ser: 1.22 mg/dL (ref 0.61–1.24)
GFR, Estimated: 59 mL/min — ABNORMAL LOW (ref >60)
Glucose, Bld: 103 mg/dL — ABNORMAL HIGH (ref 70–99)
Potassium: 4.1 mmol/L (ref 3.5–5.1)
Sodium: 139 mmol/L (ref 135–145)
Total Bilirubin: 1.9 mg/dL — ABNORMAL HIGH (ref <1.2)
Total Protein: 6.8 g/dL (ref 6.5–8.1)

## 2023-09-08 LAB — CBC
HCT: 41.1 % (ref 39.0–52.0)
Hemoglobin: 13.5 g/dL (ref 13.0–17.0)
MCH: 29.3 pg (ref 26.0–34.0)
MCHC: 32.8 g/dL (ref 30.0–36.0)
MCV: 89.2 fL (ref 80.0–100.0)
Platelets: 225 10*3/uL (ref 150–400)
RBC: 4.61 MIL/uL (ref 4.22–5.81)
RDW: 14.7 % (ref 11.5–15.5)
WBC: 7.7 10*3/uL (ref 4.0–10.5)
nRBC: 0 % (ref 0.0–0.2)

## 2023-09-08 LAB — CBG MONITORING, ED: Glucose-Capillary: 95 mg/dL (ref 70–99)

## 2023-09-08 LAB — GLUCOSE, CAPILLARY
Glucose-Capillary: 168 mg/dL — ABNORMAL HIGH (ref 70–99)
Glucose-Capillary: 113 mg/dL — ABNORMAL HIGH (ref 70–99)

## 2023-09-08 LAB — ECHOCARDIOGRAM LIMITED
Height: 67 in
S' Lateral: 5.6 cm
Weight: 3040 [oz_av]

## 2023-09-08 LAB — TSH: TSH: 2.03 u[IU]/mL (ref 0.350–4.500)

## 2023-09-08 LAB — MAGNESIUM: Magnesium: 2 mg/dL (ref 1.7–2.4)

## 2023-09-08 MED ORDER — FUROSEMIDE 10 MG/ML IJ SOLN
40.0000 mg | Freq: Two times a day (BID) | INTRAMUSCULAR | Status: DC
  Administered 2023-09-08 – 2023-09-09 (×4): 40 mg via INTRAVENOUS
  Filled 2023-09-08 (×4): qty 4

## 2023-09-08 MED ORDER — LORAZEPAM 0.5 MG PO TABS
0.5000 mg | ORAL_TABLET | Freq: Three times a day (TID) | ORAL | Status: DC | PRN
  Administered 2023-09-08 (×2): 0.5 mg via ORAL
  Filled 2023-09-08 (×2): qty 1

## 2023-09-08 MED ORDER — POLYVINYL ALCOHOL 1.4 % OP SOLN
1.0000 [drp] | Freq: Two times a day (BID) | OPHTHALMIC | Status: DC
Start: 2023-09-08 — End: 2023-09-11
  Administered 2023-09-08 – 2023-09-11 (×5): 1 [drp] via OPHTHALMIC
  Filled 2023-09-08 (×3): qty 15

## 2023-09-08 MED ORDER — ISOSORBIDE DINITRATE 10 MG PO TABS: 10.0000 mg | ORAL_TABLET | Freq: Two times a day (BID) | ORAL | Status: DC

## 2023-09-08 MED ORDER — PERFLUTREN LIPID MICROSPHERE
1.0000 mL | INTRAVENOUS | Status: AC | PRN
Start: 2023-09-08 — End: 2023-09-08
  Administered 2023-09-08: 5 mL via INTRAVENOUS

## 2023-09-08 MED ORDER — METOPROLOL TARTRATE 12.5 MG HALF TABLET
12.5000 mg | ORAL_TABLET | Freq: Two times a day (BID) | ORAL | Status: DC
  Administered 2023-09-08 (×2): 12.5 mg via ORAL
  Filled 2023-09-08 (×2): qty 1

## 2023-09-08 NOTE — H&P (View-Only) (Signed)
 Rounding Note    Patient Name: Lucas Davis Date of Encounter: 09/08/2023  [facility] Cardiologist: Rollene Rotunda, MD   Subjective   Pt states he urinated "oceans" yesterday and is breathing a bit better. He remains on O2.   Inpatient Medications    Scheduled Meds:  enoxaparin (LOVENOX) injection  40 mg Subcutaneous Q24H   furosemide  40 mg Intravenous BID   insulin aspart  0-9 Units Subcutaneous TID WC   metoprolol tartrate  12.5 mg Oral BID   pantoprazole  40 mg Oral Daily   polyvinyl alcohol  1 drop Both Eyes BID   pravastatin  40 mg Oral QPM   sodium chloride flush  3 mL Intravenous Q12H   Continuous Infusions:  PRN Meds: acetaminophen **OR** acetaminophen, polyethylene glycol   Vital Signs    Vitals:   09/08/23 0759 09/08/23 0800 09/08/23 0815 09/08/23 0900  BP:  (!) 158/108  (!) 169/106  Pulse: 91 (!) 58 89 89  Resp: (!) 29 (!) 44 (!) 26 (!) 25  Temp:      TempSrc:      SpO2: 94% 99% 97% 98%  Weight:      Height:       No intake or output data in the 24 hours ending 09/08/23 0940    09/07/2023    2:19 PM 07/02/2023    4:30 AM 07/01/2023    4:17 AM  Last 3 Weights  Weight (lbs) 190 lb 190 lb 14.7 oz 191 lb 9.3 oz  Weight (kg) 86.183 kg 86.6 kg 86.9 kg      Telemetry    Appears sinus with ectopy and different p wave morphologies - Personally Reviewed  ECG    No new tracings - Personally Reviewed  Physical Exam   GEN: No acute distress.   Neck: minimal JVD Cardiac: RRR, no murmurs, rubs, or gallops.  Respiratory: crackles in bases GI: Soft, nontender, non-distended  MS: minimal  B LE edema; No deformity. Neuro:  Nonfocal  Psych: Normal affect   Labs    High Sensitivity Troponin:   Recent Labs  Lab 09/07/23 1328 09/07/23 1616  TROPONINIHS 120* 91*     Chemistry Recent Labs  Lab 09/07/23 1328 09/07/23 1629 09/08/23 0638  NA 136 137 139  K 4.0 4.2 4.1  CL 100  --  101  CO2 24  --  21*  GLUCOSE 115*  --  103*   BUN 27*  --  24*  CREATININE 1.10  --  1.22  CALCIUM 9.3  --  9.2  MG 2.0  --   --   PROT 7.0  --  6.8  ALBUMIN 3.6  --  3.7  AST 32  --  40  ALT 29  --  29  ALKPHOS 61  --  60  BILITOT 1.5*  --  1.9*  GFRNONAA >60  --  59*  ANIONGAP 12  --  17*    Lipids No results for input(s): "CHOL", "TRIG", "HDL", "LABVLDL", "LDLCALC", "CHOLHDL" in the last 168 hours.  Hematology Recent Labs  Lab 09/07/23 1328 09/07/23 1629 09/08/23 0515  WBC 7.5  --  7.7  RBC 4.79  --  4.61  HGB 13.7 14.3 13.5  HCT 42.9 42.0 41.1  MCV 89.6  --  89.2  MCH 28.6  --  29.3  MCHC 31.9  --  32.8  RDW 14.8  --  14.7  PLT 271  --  225   Thyroid No results  for input(s): "TSH", "FREET4" in the last 168 hours.  BNP Recent Labs  Lab 09/07/23 1404  BNP 2,474.7*    DDimer  Recent Labs  Lab 09/07/23 1405  DDIMER 0.56*     Radiology    DG Chest Portable 1 View  Result Date: 09/07/2023 CLINICAL DATA:  SOB, tachypnea. EXAM: PORTABLE CHEST 1 VIEW COMPARISON:  06/28/2023. FINDINGS: There are left retrocardiac airspace opacities obscuring the left hemidiaphragm and descending thoracic aorta suggesting combination of left lower lobe atelectasis and/or consolidation. Bilateral lung fields are otherwise clear. Bilateral costophrenic angles are clear. Stable mildly enlarged cardio-mediastinal silhouette. Aortic arch calcifications noted. No acute osseous abnormalities. The soft tissues are within normal limits. IMPRESSION: *Stable mild cardiomegaly. *Left retrocardiac opacity, as described above. Electronically Signed   By: Jules Schick M.D.   On: 09/07/2023 16:46    Cardiac Studies   Echo pending  Patient Profile     82 y.o. male with hypertension, hyperlipidemia, type 2 diabetes mellitus, rheumatoid and osteoarthritis, normal EF with grade 2 diastolic dysfunction on echocardiogram in 06/2023, during admission with symptomatic bradycardia-medical management without need for pacemaker, incidental COVID  positive diagnosis.   Assessment & Plan    Acute hypoxic respiratory failure Acute on chronic diastolic heart failure - echo pending, LV preserved on echo 06/2023 - required brief BIPAP - BNP 2475 - diuresing on 40 mg IV lasix - no I&Os in ER - hs troponin 120 --> 91 - will continue 40 mg IV lasix BID - per preliminary report, EF significantly reduced - no chest pain - stable renal functino with K 4.1   Hx of symptomatic bradycardia EKG with ?retrograde p waves vs 2:1 heart block - no longer on AV nodal agents at home, but 12.5 mg lopressor BID here - denies  dizziness, pre-syncope, syncope - telemetry today appears sinus with ectopy   Elevated troponin - suspect demand ischemia in the setting of respiratory failure - if EF down, may need to consider right and left heart cath, he remains active at baseline    For questions or updates, please contact [facility] Please consult [email or web address] for contact info under        Signed, Marcelino Duster, PA  09/08/2023, 9:40 AM

## 2023-09-08 NOTE — ED Notes (Signed)
Admitting provider notified of pt change. New orders impending.

## 2023-09-08 NOTE — ED Notes (Signed)
Pt requesting to take off Bipap. Mask removed and pt placed on 2L O2.

## 2023-09-08 NOTE — ED Notes (Signed)
Pt having increased work of breathing and becoming more tachypneic. RT at bedside and pt placed back on bipap. Admitting provider paged.

## 2023-09-08 NOTE — Progress Notes (Signed)
Pt SPO2 stable on 2L, although pretty tachypneic at times with RR up to 40s. Doesn't appear in distress or SOB. Alanda Slim, MD made aware. PRN PO ativan given.

## 2023-09-08 NOTE — ED Notes (Signed)
ED TO INPATIENT HANDOFF REPORT  ED Nurse Name and Phone #: Marchelle Folks [phone]  S Name/Age/Gender Lucas Davis 82 y.o. male Room/Bed: 009C/009C  Code Status   Code Status: Full Code  Home/SNF/Other Home Patient oriented to: self, place, time, and situation Is this baseline? Yes   Triage Complete: Triage complete  Chief Complaint Acute respiratory failure with hypoxia (HCC) [J96.01] Acute on chronic diastolic CHF (congestive heart failure) (HCC) [I50.33]  Triage Note Pt here with reports of generalized weakness and dizziness intermittently. States over the last two weeks increased shob and work of breathing. RR in triage in the 40's. Pt HR 41. States hx of bradycardia but they thought it was due to medications he was taking so he has not been on them for a while.    Allergies Allergies  Allergen Reactions   Beta Adrenergic Blockers     Symptomatic bradycardia but may have overdose, ?   Oxycontin [Oxycodone] Other (See Comments)    Hallucinations  Severe drowsiness    Level of Care/Admitting Diagnosis ED Disposition     ED Disposition  Admit   Condition  --   Comment  Hospital Area: [facility] [[id number]]  Level of Care: Progressive [102]  Admit to Progressive based on following criteria: CARDIOVASCULAR & THORACIC of moderate stability with acute coronary syndrome symptoms/low risk myocardial infarction/hypertensive urgency/arrhythmias/heart failure potentially compromising stability and stable post cardiovascular intervention patients.  Admit to Progressive based on following criteria: RESPIRATORY PROBLEMS hypoxemic/hypercapnic respiratory failure that is responsive to NIPPV (BiPAP) or High Flow Nasal Cannula (6-80 lpm). Frequent assessment/intervention, no > Q2 hrs < Q4 hrs, to maintain oxygenation and pulmonary hygiene.  May admit patient to [facility] or [facility] if equivalent level of care is available:: No  Covid Evaluation: Confirmed COVID  Negative  Diagnosis: Acute on chronic diastolic CHF (congestive heart failure) [facility]) [[id number]]  Admitting Physician: Almon Hercules [[id number]]  Attending Physician: Almon Hercules [[id number]]  Certification:: I certify this patient will need inpatient services for at least 2 midnights  Expected Medical Readiness: 09/11/2023          B Medical/Surgery History Past Medical History:  Diagnosis Date   Arthritis    Bilateral impacted cerumen 09/07/2016   COVID-19 06/30/2023   GERD (gastroesophageal reflux disease)    Hyperlipidemia    Hypertension    Neuropathy    Prostate cancer (HCC)    Type 2 diabetes mellitus (HCC) 09/07/2023   Past Surgical History:  Procedure Laterality Date   COLONOSCOPY  2006,2012   hx colon polyps   POLYPECTOMY       A IV Location/Drains/Wounds Patient Lines/Drains/Airways Status     Active Line/Drains/Airways     Name Placement date Placement time Site Days   Peripheral IV 09/07/23 20 G Right Antecubital 09/07/23  1420  Antecubital  1   Pressure Injury 06/28/23 Sacrum Stage 1 -  Intact skin with non-blanchable redness of a localized area usually over a bony prominence. 06/28/23  1400  -- 72            Intake/Output Last 24 hours No intake or output data in the 24 hours ending 09/08/23 1144  Labs/Imaging Results for orders placed or performed during the hospital encounter of 09/07/23 (from the past 48 hour(s))  CBC with Differential     Status: None   Collection Time: 09/07/23  1:28 PM  Result Value Ref Range   WBC 7.5 4.0 - 10.5 K/uL   RBC 4.79 4.22 -  5.81 MIL/uL   Hemoglobin 13.7 13.0 - 17.0 g/dL   HCT 09.8 11.9 - 14.7 %   MCV 89.6 80.0 - 100.0 fL   MCH 28.6 26.0 - 34.0 pg   MCHC 31.9 30.0 - 36.0 g/dL   RDW 82.9 56.2 - 13.0 %   Platelets 271 150 - 400 K/uL   nRBC 0.0 0.0 - 0.2 %   Neutrophils Relative % 60 %   Neutro Abs 4.5 1.7 - 7.7 K/uL   Lymphocytes Relative 28 %   Lymphs Abs 2.1 0.7 - 4.0 K/uL   Monocytes Relative 11 %    Monocytes Absolute 0.8 0.1 - 1.0 K/uL   Eosinophils Relative 1 %   Eosinophils Absolute 0.0 0.0 - 0.5 K/uL   Basophils Relative 0 %   Basophils Absolute 0.0 0.0 - 0.1 K/uL   Immature Granulocytes 0 %   Abs Immature Granulocytes 0.01 0.00 - 0.07 K/uL    Comment: Performed at [facility], [address], [place], [place] [address]  Comprehensive metabolic panel     Status: Abnormal   Collection Time: 09/07/23  1:28 PM  Result Value Ref Range   Sodium 136 135 - 145 mmol/L   Potassium 4.0 3.5 - 5.1 mmol/L   Chloride 100 98 - 111 mmol/L   CO2 24 22 - 32 mmol/L   Glucose, Bld 115 (H) 70 - 99 mg/dL    Comment: Glucose reference range applies only to samples taken after fasting for at least 8 hours.   BUN 27 (H) 8 - 23 mg/dL   Creatinine, Ser 4.69 0.61 - 1.24 mg/dL   Calcium 9.3 8.9 - 62.9 mg/dL   Total Protein 7.0 6.5 - 8.1 g/dL   Albumin 3.6 3.5 - 5.0 g/dL   AST 32 15 - 41 U/L   ALT 29 0 - 44 U/L   Alkaline Phosphatase 61 38 - 126 U/L   Total Bilirubin 1.5 (H) <1.2 mg/dL   GFR, Estimated >52 >84 mL/min    Comment: (NOTE) Calculated using the CKD-EPI Creatinine Equation (2021)    Anion gap 12 5 - 15    Comment: Performed at [facility], [address], [place], [place] [address]  Magnesium     Status: None   Collection Time: 09/07/23  1:28 PM  Result Value Ref Range   Magnesium 2.0 1.7 - 2.4 mg/dL    Comment: Performed at [facility], [address], [place], [place] [address]  Troponin I (High Sensitivity)     Status: Abnormal   Collection Time: 09/07/23  1:28 PM  Result Value Ref Range   Troponin I (High Sensitivity) 120 (HH) <18 ng/L    Comment: CRITICAL RESULT CALLED TO, READ BACK BY AND VERIFIED WITH W. HICKS RN 09/07/2023 1604 BNUNNERY (NOTE) Elevated high sensitivity troponin I (hsTnI) values and significant  changes across serial measurements may suggest ACS but many other  chronic and acute conditions are known to elevate hsTnI results.  Refer to  the "Links" section for chest pain algorithms and additional  guidance. Performed at [facility], [address], [place], [place] [address]   Brain natriuretic peptide     Status: Abnormal   Collection Time: 09/07/23  2:04 PM  Result Value Ref Range   B Natriuretic Peptide 2,474.7 (H) 0.0 - 100.0 pg/mL    Comment: Performed at [facility], [address] [place], [place] [address]  D-dimer, quantitative  Status: Abnormal   Collection Time: 09/07/23  2:05 PM  Result Value Ref Range   D-Dimer, Quant 0.56 (H) 0.00 - 0.50 ug/mL-FEU    Comment: (NOTE) At the manufacturer cut-off value of 0.5 g/mL FEU, this assay has a negative predictive value of 95-100%.This assay is intended for use in conjunction with a clinical pretest probability (PTP) assessment model to exclude pulmonary embolism (PE) and deep venous thrombosis (DVT) in outpatients suspected of PE or DVT. Results should be correlated with clinical presentation. Performed at [facility], [address], [place], [place] [address]   Resp panel by RT-PCR (RSV, Flu A&B, Covid) Anterior Nasal Swab     Status: None   Collection Time: 09/07/23  2:06 PM   Specimen: Anterior Nasal Swab  Result Value Ref Range   SARS Coronavirus 2 by RT PCR NEGATIVE NEGATIVE   Influenza A by PCR NEGATIVE NEGATIVE   Influenza B by PCR NEGATIVE NEGATIVE    Comment: (NOTE) The Xpert Xpress SARS-CoV-2/FLU/RSV plus assay is intended as an aid in the diagnosis of influenza from Nasopharyngeal swab specimens and should not be used as a sole basis for treatment. Nasal washings and aspirates are unacceptable for Xpert Xpress SARS-CoV-2/FLU/RSV testing.  Fact Sheet for Patients: BloggerCourse.com  Fact Sheet for Healthcare Providers: SeriousBroker.it  This test is not yet approved or cleared by the [place] FDA and has been authorized for detection and/or diagnosis of  SARS-CoV-2 by FDA under an Emergency Use Authorization (EUA). This EUA will remain in effect (meaning this test can be used) for the duration of the COVID-19 declaration under Section 564(b)(1) of the Act, 21 U.S.C. section 360bbb-3(b)(1), unless the authorization is terminated or revoked.     Resp Syncytial Virus by PCR NEGATIVE NEGATIVE    Comment: (NOTE) Fact Sheet for Patients: BloggerCourse.com  Fact Sheet for Healthcare Providers: SeriousBroker.it  This test is not yet approved or cleared by the [place] FDA and has been authorized for detection and/or diagnosis of SARS-CoV-2 by FDA under an Emergency Use Authorization (EUA). This EUA will remain in effect (meaning this test can be used) for the duration of the COVID-19 declaration under Section 564(b)(1) of the Act, 21 U.S.C. section 360bbb-3(b)(1), unless the authorization is terminated or revoked.  Performed at [facility], [address] [place], [place] [address]   Troponin I (High Sensitivity)     Status: Abnormal   Collection Time: 09/07/23  4:16 PM  Result Value Ref Range   Troponin I (High Sensitivity) 91 (H) <18 ng/L    Comment: DELTA CHECK NOTED (NOTE) Elevated high sensitivity troponin I (hsTnI) values and significant  changes across serial measurements may suggest ACS but many other  chronic and acute conditions are known to elevate hsTnI results.  Refer to the "Links" section for chest pain algorithms and additional  guidance. Performed at [facility], [address], [place], [place] [address]   I-Stat venous blood gas, [facility], [facility], [facility])     Status: Abnormal   Collection Time: 09/07/23  4:29 PM  Result Value Ref Range   pH, Ven 7.395 7.25 - 7.43   pCO2, Ven 37.0 (L) 44 - 60 mmHg   pO2, Ven 27 (LL) 32 - 45 mmHg   Bicarbonate 22.7 20.0 - 28.0 mmol/L   TCO2 24 22 - 32 mmol/L   O2 Saturation 51 %   Acid-base deficit 2.0 0.0 - 2.0  mmol/L   Sodium 137 135 - 145  mmol/L   Potassium 4.2 3.5 - 5.1 mmol/L   Calcium, Ion 1.12 (L) 1.15 - 1.40 mmol/L   HCT 42.0 39.0 - 52.0 %   Hemoglobin 14.3 13.0 - 17.0 g/dL   Sample type VENOUS    Comment NOTIFIED PHYSICIAN   CBG monitoring, ED     Status: Abnormal   Collection Time: 09/07/23 11:13 PM  Result Value Ref Range   Glucose-Capillary 134 (H) 70 - 99 mg/dL    Comment: Glucose reference range applies only to samples taken after fasting for at least 8 hours.  CBC     Status: None   Collection Time: 09/08/23  5:15 AM  Result Value Ref Range   WBC 7.7 4.0 - 10.5 K/uL   RBC 4.61 4.22 - 5.81 MIL/uL   Hemoglobin 13.5 13.0 - 17.0 g/dL   HCT 24.4 01.0 - 27.2 %   MCV 89.2 80.0 - 100.0 fL   MCH 29.3 26.0 - 34.0 pg   MCHC 32.8 30.0 - 36.0 g/dL   RDW 53.6 64.4 - 03.4 %   Platelets 225 150 - 400 K/uL   nRBC 0.0 0.0 - 0.2 %    Comment: Performed at [facility], [address], [place], [place] [address]  Comprehensive metabolic panel     Status: Abnormal   Collection Time: 09/08/23  6:38 AM  Result Value Ref Range   Sodium 139 135 - 145 mmol/L   Potassium 4.1 3.5 - 5.1 mmol/L   Chloride 101 98 - 111 mmol/L   CO2 21 (L) 22 - 32 mmol/L   Glucose, Bld 103 (H) 70 - 99 mg/dL    Comment: Glucose reference range applies only to samples taken after fasting for at least 8 hours.   BUN 24 (H) 8 - 23 mg/dL   Creatinine, Ser 5.63 0.61 - 1.24 mg/dL   Calcium 9.2 8.9 - 87.5 mg/dL   Total Protein 6.8 6.5 - 8.1 g/dL   Albumin 3.7 3.5 - 5.0 g/dL   AST 40 15 - 41 U/L   ALT 29 0 - 44 U/L   Alkaline Phosphatase 60 38 - 126 U/L   Total Bilirubin 1.9 (H) <1.2 mg/dL   GFR, Estimated 59 (L) >60 mL/min    Comment: (NOTE) Calculated using the CKD-EPI Creatinine Equation (2021)    Anion gap 17 (H) 5 - 15    Comment: Performed at [facility], [address] [place], [place] [address]  CBG monitoring, ED     Status: None   Collection Time: 09/08/23  9:03 AM  Result Value Ref Range    Glucose-Capillary 95 70 - 99 mg/dL    Comment: Glucose reference range applies only to samples taken after fasting for at least 8 hours.   DG Chest Portable 1 View  Result Date: 09/07/2023 CLINICAL DATA:  SOB, tachypnea. EXAM: PORTABLE CHEST 1 VIEW COMPARISON:  06/28/2023. FINDINGS: There are left retrocardiac airspace opacities obscuring the left hemidiaphragm and descending thoracic aorta suggesting combination of left lower lobe atelectasis and/or consolidation. Bilateral lung fields are otherwise clear. Bilateral costophrenic angles are clear. Stable mildly enlarged cardio-mediastinal silhouette. Aortic arch calcifications noted. No acute osseous abnormalities. The soft tissues are within normal limits. IMPRESSION: *Stable mild cardiomegaly. *Left retrocardiac opacity, as described above. Electronically Signed   By: Jules Schick M.D.   On: 09/07/2023 16:46    Pending Labs Unresulted Labs (From admission, onward)     Start     Ordered   09/14/23  0500  Creatinine, serum  (enoxaparin (LOVENOX)    CrCl >/= 30 ml/min)  Weekly,   R     Comments: while on enoxaparin therapy    09/07/23 1644   09/09/23 0500  Renal function panel  Tomorrow morning,   R        09/08/23 1058   09/09/23 0500  Magnesium  Tomorrow morning,   R        09/08/23 1058   09/09/23 0500  CBC  Tomorrow morning,   R        09/08/23 1058   09/08/23 1058  TSH  Add-on,   AD        09/08/23 1058   09/08/23 0706  Magnesium  Add-on,   AD        09/08/23 0705            Vitals/Pain Today's Vitals   09/08/23 1000 09/08/23 1001 09/08/23 1002 09/08/23 1111  BP: (!) 170/116 (!) 170/116  133/80  Pulse: 88 (!) 101  62  Resp: (!) 41   (!) 24  Temp:   (!) 97.5 F (36.4 C) (!) 97.5 F (36.4 C)  TempSrc:   Axillary Oral  SpO2: 92%   99%  Weight:      Height:      PainSc:        Isolation Precautions No active isolations  Medications Medications  pravastatin (PRAVACHOL) tablet 40 mg (40 mg Oral Not Given 09/07/23  1832)  enoxaparin (LOVENOX) injection 40 mg (40 mg Subcutaneous Given 09/07/23 1832)  sodium chloride flush (NS) 0.9 % injection 3 mL (3 mLs Intravenous Given 09/08/23 1002)  acetaminophen (TYLENOL) tablet 650 mg (has no administration in time range)    Or  acetaminophen (TYLENOL) suppository 650 mg (has no administration in time range)  polyethylene glycol (MIRALAX / GLYCOLAX) packet 17 g (has no administration in time range)  insulin aspart (novoLOG) injection 0-9 Units ( Subcutaneous Not Given 09/08/23 0904)  pantoprazole (PROTONIX) EC tablet 40 mg (40 mg Oral Given 09/08/23 1001)  furosemide (LASIX) injection 40 mg (40 mg Intravenous Given 09/08/23 0415)  polyvinyl alcohol (LIQUIFILM TEARS) 1.4 % ophthalmic solution 1 drop (1 drop Both Eyes Given 09/08/23 0956)  metoprolol tartrate (LOPRESSOR) tablet 12.5 mg (12.5 mg Oral Given 09/08/23 1001)  perflutren lipid microspheres (DEFINITY) IV suspension (5 mLs Intravenous Given 09/08/23 1011)  LORazepam (ATIVAN) tablet 0.5 mg (has no administration in time range)  isosorbide dinitrate (ISORDIL) tablet 10 mg (has no administration in time range)  ipratropium-albuterol (DUONEB) 0.5-2.5 (3) MG/3ML nebulizer solution 3 mL (3 mLs Nebulization Given 09/07/23 1532)  furosemide (LASIX) injection 40 mg (40 mg Intravenous Given 09/07/23 1616)    Mobility walks     Focused Assessments Cardiac Assessment Handoff:    No results found for: "CKTOTAL", "CKMB", "CKMBINDEX", "TROPONINI" Lab Results  Component Value Date   DDIMER 0.56 (H) 09/07/2023   Does the Patient currently have chest pain? No    R Recommendations: See Admitting Provider Note  Report given to:   Additional Notes: Pt ambulates independently to bathroom

## 2023-09-08 NOTE — Plan of Care (Signed)
  Problem: Education: Goal: Ability to demonstrate management of disease process will improve Outcome: Progressing Goal: Ability to verbalize understanding of medication therapies will improve Outcome: Progressing Goal: Individualized Educational Video(s) Outcome: Progressing   Problem: Activity: Goal: Capacity to carry out activities will improve Outcome: Progressing   Problem: Cardiac: Goal: Ability to achieve and maintain adequate cardiopulmonary perfusion will improve Outcome: Progressing   Problem: Education: Goal: Ability to describe self-care measures that may prevent or decrease complications (Diabetes Survival Skills Education) will improve Outcome: Progressing Goal: Individualized Educational Video(s) Outcome: Progressing   Problem: Coping: Goal: Ability to adjust to condition or change in health will improve Outcome: Progressing   Problem: Fluid Volume: Goal: Ability to maintain a balanced intake and output will improve Outcome: Progressing   Problem: Health Behavior/Discharge Planning: Goal: Ability to identify and utilize available resources and services will improve Outcome: Progressing Goal: Ability to manage health-related needs will improve Outcome: Progressing   Problem: Metabolic: Goal: Ability to maintain appropriate glucose levels will improve Outcome: Progressing   Problem: Nutritional: Goal: Maintenance of adequate nutrition will improve Outcome: Progressing Goal: Progress toward achieving an optimal weight will improve Outcome: Progressing   Problem: Skin Integrity: Goal: Risk for impaired skin integrity will decrease Outcome: Progressing   Problem: Tissue Perfusion: Goal: Adequacy of tissue perfusion will improve Outcome: Progressing   Problem: Education: Goal: Knowledge of General Education information will improve Description: Including pain rating scale, medication(s)/side effects and non-pharmacologic comfort measures Outcome:  Progressing   Problem: Health Behavior/Discharge Planning: Goal: Ability to manage health-related needs will improve Outcome: Progressing   Problem: Clinical Measurements: Goal: Ability to maintain clinical measurements within normal limits will improve Outcome: Progressing Goal: Will remain free from infection Outcome: Progressing Goal: Diagnostic test results will improve Outcome: Progressing Goal: Respiratory complications will improve Outcome: Progressing Goal: Cardiovascular complication will be avoided Outcome: Progressing   Problem: Activity: Goal: Risk for activity intolerance will decrease Outcome: Progressing   Problem: Nutrition: Goal: Adequate nutrition will be maintained Outcome: Progressing   Problem: Coping: Goal: Level of anxiety will decrease Outcome: Progressing   Problem: Elimination: Goal: Will not experience complications related to bowel motility Outcome: Progressing Goal: Will not experience complications related to urinary retention Outcome: Progressing   Problem: Pain Management: Goal: General experience of comfort will improve Outcome: Progressing   Problem: Safety: Goal: Ability to remain free from injury will improve Outcome: Progressing   Problem: Skin Integrity: Goal: Risk for impaired skin integrity will decrease Outcome: Progressing

## 2023-09-08 NOTE — Progress Notes (Addendum)
Rounding Note    Patient Name: Lucas Davis Date of Encounter: 09/08/2023  [facility] Cardiologist: Rollene Rotunda, MD   Subjective   Pt states he urinated "oceans" yesterday and is breathing a bit better. He remains on O2.   Inpatient Medications    Scheduled Meds:  enoxaparin (LOVENOX) injection  40 mg Subcutaneous Q24H   furosemide  40 mg Intravenous BID   insulin aspart  0-9 Units Subcutaneous TID WC   metoprolol tartrate  12.5 mg Oral BID   pantoprazole  40 mg Oral Daily   polyvinyl alcohol  1 drop Both Eyes BID   pravastatin  40 mg Oral QPM   sodium chloride flush  3 mL Intravenous Q12H   Continuous Infusions:  PRN Meds: acetaminophen **OR** acetaminophen, polyethylene glycol   Vital Signs    Vitals:   09/08/23 0759 09/08/23 0800 09/08/23 0815 09/08/23 0900  BP:  (!) 158/108  (!) 169/106  Pulse: 91 (!) 58 89 89  Resp: (!) 29 (!) 44 (!) 26 (!) 25  Temp:      TempSrc:      SpO2: 94% 99% 97% 98%  Weight:      Height:       No intake or output data in the 24 hours ending 09/08/23 0940    09/07/2023    2:19 PM 07/02/2023    4:30 AM 07/01/2023    4:17 AM  Last 3 Weights  Weight (lbs) 190 lb 190 lb 14.7 oz 191 lb 9.3 oz  Weight (kg) 86.183 kg 86.6 kg 86.9 kg      Telemetry    Appears sinus with ectopy and different p wave morphologies - Personally Reviewed  ECG    No new tracings - Personally Reviewed  Physical Exam   GEN: No acute distress.   Neck: minimal JVD Cardiac: RRR, no murmurs, rubs, or gallops.  Respiratory: crackles in bases GI: Soft, nontender, non-distended  MS: minimal  B LE edema; No deformity. Neuro:  Nonfocal  Psych: Normal affect   Labs    High Sensitivity Troponin:   Recent Labs  Lab 09/07/23 1328 09/07/23 1616  TROPONINIHS 120* 91*     Chemistry Recent Labs  Lab 09/07/23 1328 09/07/23 1629 09/08/23 0638  NA 136 137 139  K 4.0 4.2 4.1  CL 100  --  101  CO2 24  --  21*  GLUCOSE 115*  --  103*   BUN 27*  --  24*  CREATININE 1.10  --  1.22  CALCIUM 9.3  --  9.2  MG 2.0  --   --   PROT 7.0  --  6.8  ALBUMIN 3.6  --  3.7  AST 32  --  40  ALT 29  --  29  ALKPHOS 61  --  60  BILITOT 1.5*  --  1.9*  GFRNONAA >60  --  59*  ANIONGAP 12  --  17*    Lipids No results for input(s): "CHOL", "TRIG", "HDL", "LABVLDL", "LDLCALC", "CHOLHDL" in the last 168 hours.  Hematology Recent Labs  Lab 09/07/23 1328 09/07/23 1629 09/08/23 0515  WBC 7.5  --  7.7  RBC 4.79  --  4.61  HGB 13.7 14.3 13.5  HCT 42.9 42.0 41.1  MCV 89.6  --  89.2  MCH 28.6  --  29.3  MCHC 31.9  --  32.8  RDW 14.8  --  14.7  PLT 271  --  225   Thyroid No results  for input(s): "TSH", "FREET4" in the last 168 hours.  BNP Recent Labs  Lab 09/07/23 1404  BNP 2,474.7*    DDimer  Recent Labs  Lab 09/07/23 1405  DDIMER 0.56*     Radiology    DG Chest Portable 1 View  Result Date: 09/07/2023 CLINICAL DATA:  SOB, tachypnea. EXAM: PORTABLE CHEST 1 VIEW COMPARISON:  06/28/2023. FINDINGS: There are left retrocardiac airspace opacities obscuring the left hemidiaphragm and descending thoracic aorta suggesting combination of left lower lobe atelectasis and/or consolidation. Bilateral lung fields are otherwise clear. Bilateral costophrenic angles are clear. Stable mildly enlarged cardio-mediastinal silhouette. Aortic arch calcifications noted. No acute osseous abnormalities. The soft tissues are within normal limits. IMPRESSION: *Stable mild cardiomegaly. *Left retrocardiac opacity, as described above. Electronically Signed   By: Jules Schick M.D.   On: 09/07/2023 16:46    Cardiac Studies   Echo pending  Patient Profile     82 y.o. male with hypertension, hyperlipidemia, type 2 diabetes mellitus, rheumatoid and osteoarthritis, normal EF with grade 2 diastolic dysfunction on echocardiogram in 06/2023, during admission with symptomatic bradycardia-medical management without need for pacemaker, incidental COVID  positive diagnosis.   Assessment & Plan    Acute hypoxic respiratory failure Acute on chronic diastolic heart failure - echo pending, LV preserved on echo 06/2023 - required brief BIPAP - BNP 2475 - diuresing on 40 mg IV lasix - no I&Os in ER - hs troponin 120 --> 91 - will continue 40 mg IV lasix BID - per preliminary report, EF significantly reduced - no chest pain - stable renal functino with K 4.1   Hx of symptomatic bradycardia EKG with ?retrograde p waves vs 2:1 heart block - no longer on AV nodal agents at home, but 12.5 mg lopressor BID here - denies  dizziness, pre-syncope, syncope - telemetry today appears sinus with ectopy   Elevated troponin - suspect demand ischemia in the setting of respiratory failure - if EF down, may need to consider right and left heart cath, he remains active at baseline    For questions or updates, please contact [facility] Please consult [email or web address] for contact info under        Signed, Marcelino Duster, PA  09/08/2023, 9:40 AM

## 2023-09-08 NOTE — Progress Notes (Addendum)
PROGRESS NOTE  Lucas Davis VPX:[id number] DOB: 1941/07/26   PCP: [facility]  Patient is from: Home.  Lives with his wife.  Uses rolling walker at baseline.  DOA: 09/07/2023 LOS: 0  Chief complaints Chief Complaint  Patient presents with   Respiratory Distress   Weakness     Brief Narrative / Interim history: 82 year old M with PMH of diastolic CHF, RA, HTN, DM-2, HLD, osteoarthritis, tremors, fatty liver and diminished hearing presenting with shortness of breath for about 2 to 3 weeks that has acutely gotten worse for 2 days prior to presentation with associated orthopnea, night cough and edema, and admitted for acute respiratory failure with hypoxia in the setting of acute on chronic diastolic CHF.  Patient is not on diuretics at home.  In ED, tachypneic to 40s and hypertensive.  No documented desaturation below 90% but started on 2 L and then BiPAP.  BNP > 2400.  Portable CXR with stable mild cardiomegaly and left retrocardiac opacity.  120>> 91.  D-dimer negative.  VBG with normal pH and pCO2 of 38.  COVID-19, influenza and RSV PCR nonreactive.  EKG sinus rhythm with first-degree AVB and PVCs.  Started on IV Lasix.  Echocardiogram ordered.  Cardiology consulted.   Subjective: Seen and examined earlier this morning.  Reportedly had increased work of breathing about 3 AM last night and started on BiPAP.  However, he did not wear BiPAP for long and weaned to nasal cannula.  Reports feeling better this morning.  Denies pain.  Objective: Vitals:   09/08/23 0900 09/08/23 1000 09/08/23 1001 09/08/23 1002  BP: (!) 169/106 (!) 170/116 (!) 170/116   Pulse: 89 88 (!) 101   Resp: (!) 25 (!) 41    Temp:    (!) 97.5 F (36.4 C)  TempSrc:    Axillary  SpO2: 98% 92%    Weight:      Height:        Examination:  GENERAL: No apparent distress.  Nontoxic. HEENT: MMM.  Vision grossly intact.  Hard of hearing. NECK: Supple.  No apparent JVD.  RESP:  No IWOB.   Tachypneic.  Fair aeration bilaterally. CVS:  RRR. Heart sounds normal.  ABD/GI/GU: BS+. Abd soft, NTND.  MSK/EXT:  Moves extremities. No apparent deformity. No edema.  SKIN: no apparent skin lesion or wound NEURO: Awake, alert and oriented appropriately.   No apparent focal neuro deficit. PSYCH: Calm. Normal affect.   Procedures:  None  Microbiology summarized: COVID-19, influenza and RSV PCR nonreactive  Assessment and plan: Acute on chronic diastolic CHF: Presents with progressive dyspnea, orthopnea, cough and edema.  BNP > 2400.  Mildly elevated troponin without significant delta.  EKG with sinus rhythm with first-degree AVB and PVCs.  Portable CXR with retrocardiac opacity.  TTE in 06/2023 with LVEF of 55 to 60% and G2 DD.  Not on diuretics at home.  Started on IV Lasix and BiPAP with improvement in symptoms.  Did not tolerate BiPAP much.  Still with significant tachypnea despite improvement in symptoms.  I&O incomplete.  BP elevated.  Creatinine stable. -Cardiology on board -Continue IV Lasix 40 mg twice daily -Follow repeat echocardiogram -Strict intake and output, daily weight, renal functions and electrolytes  Uncontrolled hypertension: BP rising after interval improvement.  Not on antihypertensive meds at home.  Anxiety driven?  Denies drinking alcohol. -IV Lasix as above -Add low-dose Isordil. -Will try low-dose Ativan.  First-degree AV block/frequent PVCs:  -Trial of low-dose metoprolol for PVCs -Telemetry monitoring -Optimize  electrolytes -Check TSH  Elevated troponin/type II MI due to acute CHF.  EKG without acute ischemic finding -Diuretics as above -Follow echocardiogram  NIDDM-2 with hyperglycemia: A1c 6.5% in 06/2023.  Does not seem to be on medication at home. Recent Labs  Lab 09/07/23 2313 09/08/23 0903  GLUCAP 134* 95  -Continue current insulin regimen -Continue pravastatin  History of rheumatoid arthritis: Not on DMARDs. Osteoarthritis -Hold home  Celebrex -Tylenol as needed   Essential tremor -Propranolol was discontinued after admission in the summer for symptomatic bradycardia for which he required ICU admission and dopamine infusion.   GERD -Continue PPI   Body mass index is 29.76 kg/m.    DVT prophylaxis:  enoxaparin (LOVENOX) injection 40 mg Start: 09/07/23 1645  Code Status: Full code Family Communication: None at bedside Level of care: Progressive Status is: Observation The patient will require care spanning > 2 midnights and should be moved to inpatient because: Acute CHF   Final disposition: Likely home once medically stable Consultants:  Cardiology  55 minutes with more than 50% spent in reviewing records, counseling patient/family and coordinating care.   Sch Meds:  Scheduled Meds:  enoxaparin (LOVENOX) injection  40 mg Subcutaneous Q24H   furosemide  40 mg Intravenous BID   insulin aspart  0-9 Units Subcutaneous TID WC   metoprolol tartrate  12.5 mg Oral BID   pantoprazole  40 mg Oral Daily   polyvinyl alcohol  1 drop Both Eyes BID   pravastatin  40 mg Oral QPM   sodium chloride flush  3 mL Intravenous Q12H   Continuous Infusions: PRN Meds:.acetaminophen **OR** acetaminophen, perflutren lipid microspheres (DEFINITY) IV suspension, polyethylene glycol  Antimicrobials: Anti-infectives (From admission, onward)    None        I have personally reviewed the following labs and images: CBC: Recent Labs  Lab 09/07/23 1328 09/07/23 1629 09/08/23 0515  WBC 7.5  --  7.7  NEUTROABS 4.5  --   --   HGB 13.7 14.3 13.5  HCT 42.9 42.0 41.1  MCV 89.6  --  89.2  PLT 271  --  225   BMP &GFR Recent Labs  Lab 09/07/23 1328 09/07/23 1629 09/08/23 0638  NA 136 137 139  K 4.0 4.2 4.1  CL 100  --  101  CO2 24  --  21*  GLUCOSE 115*  --  103*  BUN 27*  --  24*  CREATININE 1.10  --  1.22  CALCIUM 9.3  --  9.2  MG 2.0  --   --    Estimated Creatinine Clearance: 48.9 mL/min (by C-G formula  based on SCr of 1.22 mg/dL). Liver & Pancreas: Recent Labs  Lab 09/07/23 1328 09/08/23 0638  AST 32 40  ALT 29 29  ALKPHOS 61 60  BILITOT 1.5* 1.9*  PROT 7.0 6.8  ALBUMIN 3.6 3.7   No results for input(s): "LIPASE", "AMYLASE" in the last 168 hours. No results for input(s): "AMMONIA" in the last 168 hours. Diabetic: No results for input(s): "HGBA1C" in the last 72 hours. Recent Labs  Lab 09/07/23 2313 09/08/23 0903  GLUCAP 134* 95   Cardiac Enzymes: No results for input(s): "CKTOTAL", "CKMB", "CKMBINDEX", "TROPONINI" in the last 168 hours. No results for input(s): "PROBNP" in the last 8760 hours. Coagulation Profile: No results for input(s): "INR", "PROTIME" in the last 168 hours. Thyroid Function Tests: No results for input(s): "TSH", "T4TOTAL", "FREET4", "T3FREE", "THYROIDAB" in the last 72 hours. Lipid Profile: No results for input(s): "CHOL", "HDL", "  LDLCALC", "TRIG", "CHOLHDL", "LDLDIRECT" in the last 72 hours. Anemia Panel: No results for input(s): "VITAMINB12", "FOLATE", "FERRITIN", "TIBC", "IRON", "RETICCTPCT" in the last 72 hours. Urine analysis: No results found for: "COLORURINE", "APPEARANCEUR", "LABSPEC", "PHURINE", "GLUCOSEU", "HGBUR", "BILIRUBINUR", "KETONESUR", "PROTEINUR", "UROBILINOGEN", "NITRITE", "LEUKOCYTESUR" Sepsis Labs: Invalid input(s): "PROCALCITONIN", "LACTICIDVEN"  Microbiology: Recent Results (from the past 240 hour(s))  Resp panel by RT-PCR (RSV, Flu A&B, Covid) Anterior Nasal Swab     Status: None   Collection Time: 09/07/23  2:06 PM   Specimen: Anterior Nasal Swab  Result Value Ref Range Status   SARS Coronavirus 2 by RT PCR NEGATIVE NEGATIVE Final   Influenza A by PCR NEGATIVE NEGATIVE Final   Influenza B by PCR NEGATIVE NEGATIVE Final    Comment: (NOTE) The Xpert Xpress SARS-CoV-2/FLU/RSV plus assay is intended as an aid in the diagnosis of influenza from Nasopharyngeal swab specimens and should not be used as a sole basis for  treatment. Nasal washings and aspirates are unacceptable for Xpert Xpress SARS-CoV-2/FLU/RSV testing.  Fact Sheet for Patients: BloggerCourse.com  Fact Sheet for Healthcare Providers: SeriousBroker.it  This test is not yet approved or cleared by the [place] FDA and has been authorized for detection and/or diagnosis of SARS-CoV-2 by FDA under an Emergency Use Authorization (EUA). This EUA will remain in effect (meaning this test can be used) for the duration of the COVID-19 declaration under Section 564(b)(1) of the Act, 21 U.S.C. section 360bbb-3(b)(1), unless the authorization is terminated or revoked.     Resp Syncytial Virus by PCR NEGATIVE NEGATIVE Final    Comment: (NOTE) Fact Sheet for Patients: BloggerCourse.com  Fact Sheet for Healthcare Providers: SeriousBroker.it  This test is not yet approved or cleared by the [place] FDA and has been authorized for detection and/or diagnosis of SARS-CoV-2 by FDA under an Emergency Use Authorization (EUA). This EUA will remain in effect (meaning this test can be used) for the duration of the COVID-19 declaration under Section 564(b)(1) of the Act, 21 U.S.C. section 360bbb-3(b)(1), unless the authorization is terminated or revoked.  Performed at [facility], [address], [place], [place] [address]     Radiology Studies: DG Chest Portable 1 View  Result Date: 09/07/2023 CLINICAL DATA:  SOB, tachypnea. EXAM: PORTABLE CHEST 1 VIEW COMPARISON:  06/28/2023. FINDINGS: There are left retrocardiac airspace opacities obscuring the left hemidiaphragm and descending thoracic aorta suggesting combination of left lower lobe atelectasis and/or consolidation. Bilateral lung fields are otherwise clear. Bilateral costophrenic angles are clear. Stable mildly enlarged cardio-mediastinal silhouette. Aortic arch calcifications noted.  No acute osseous abnormalities. The soft tissues are within normal limits. IMPRESSION: *Stable mild cardiomegaly. *Left retrocardiac opacity, as described above. Electronically Signed   By: Jules Schick M.D.   On: 09/07/2023 16:46      Emmali Karow T. Marbin Olshefski [facility]  If 7PM-7AM, please contact night-coverage [email or web address] 09/08/2023, 10:40 AM

## 2023-09-09 ENCOUNTER — Inpatient Hospital Stay (HOSPITAL_COMMUNITY)
Admission: EM | Disposition: A | Discharge status: Home / Self Care | Payer: Self-pay | Source: Home / Self Care | Attending: Internal Medicine

## 2023-09-09 ENCOUNTER — Encounter (HOSPITAL_COMMUNITY): Payer: Self-pay | Admitting: Student

## 2023-09-09 DIAGNOSIS — I2510 Atherosclerotic heart disease of native coronary artery without angina pectoris: Secondary | ICD-10-CM

## 2023-09-09 DIAGNOSIS — J9601 Acute respiratory failure with hypoxia: Secondary | ICD-10-CM | POA: Diagnosis not present

## 2023-09-09 DIAGNOSIS — I5021 Acute systolic (congestive) heart failure: Secondary | ICD-10-CM | POA: Diagnosis not present

## 2023-09-09 DIAGNOSIS — I5041 Acute combined systolic (congestive) and diastolic (congestive) heart failure: Secondary | ICD-10-CM

## 2023-09-09 DIAGNOSIS — I2489 Other forms of acute ischemic heart disease: Secondary | ICD-10-CM

## 2023-09-09 HISTORY — PX: RIGHT/LEFT HEART CATH AND CORONARY ANGIOGRAPHY: CATH118266

## 2023-09-09 LAB — CBC WITH DIFFERENTIAL/PLATELET
Abs Immature Granulocytes: 0.06 10*3/uL (ref 0.00–0.07)
Basophils Absolute: 0 10*3/uL (ref 0.0–0.1)
Basophils Relative: 0 %
Eosinophils Absolute: 0.1 10*3/uL (ref 0.0–0.5)
Eosinophils Relative: 1 %
HCT: 42.8 % (ref 39.0–52.0)
Hemoglobin: 13.7 g/dL (ref 13.0–17.0)
Immature Granulocytes: 1 %
Lymphocytes Relative: 34 %
Lymphs Abs: 2.4 10*3/uL (ref 0.7–4.0)
MCH: 28.8 pg (ref 26.0–34.0)
MCHC: 32 g/dL (ref 30.0–36.0)
MCV: 90.1 fL (ref 80.0–100.0)
Monocytes Absolute: 0.8 10*3/uL (ref 0.1–1.0)
Monocytes Relative: 12 %
Neutro Abs: 3.6 10*3/uL (ref 1.7–7.7)
Neutrophils Relative %: 52 %
Platelets: 270 10*3/uL (ref 150–400)
RBC: 4.75 MIL/uL (ref 4.22–5.81)
RDW: 14.8 % (ref 11.5–15.5)
WBC: 6.9 10*3/uL (ref 4.0–10.5)
nRBC: 0 % (ref 0.0–0.2)

## 2023-09-09 LAB — POCT I-STAT 7, (LYTES, BLD GAS, ICA,H+H)
Acid-base deficit: 2 mmol/L (ref 0.0–2.0)
Bicarbonate: 23.2 mmol/L (ref 20.0–28.0)
Calcium, Ion: 1.06 mmol/L — ABNORMAL LOW (ref 1.15–1.40)
HCT: 37 % — ABNORMAL LOW (ref 39.0–52.0)
Hemoglobin: 12.6 g/dL — ABNORMAL LOW (ref 13.0–17.0)
O2 Saturation: 88 %
Potassium: 2.8 mmol/L — ABNORMAL LOW (ref 3.5–5.1)
Sodium: 142 mmol/L (ref 135–145)
TCO2: 24 mmol/L (ref 22–32)
pCO2 arterial: 39.7 mm[Hg] (ref 32–48)
pH, Arterial: 7.374 (ref 7.35–7.45)
pO2, Arterial: 56 mm[Hg] — ABNORMAL LOW (ref 83–108)

## 2023-09-09 LAB — GLUCOSE, CAPILLARY
Glucose-Capillary: 103 mg/dL — ABNORMAL HIGH (ref 70–99)
Glucose-Capillary: 82 mg/dL (ref 70–99)
Glucose-Capillary: 91 mg/dL (ref 70–99)
Glucose-Capillary: 112 mg/dL — ABNORMAL HIGH (ref 70–99)
Glucose-Capillary: 87 mg/dL (ref 70–99)

## 2023-09-09 LAB — POCT I-STAT EG7
Acid-base deficit: 1 mmol/L (ref 0.0–2.0)
Bicarbonate: 22.9 mmol/L (ref 20.0–28.0)
Calcium, Ion: 1.14 mmol/L — ABNORMAL LOW (ref 1.15–1.40)
HCT: 37 % — ABNORMAL LOW (ref 39.0–52.0)
Hemoglobin: 12.6 g/dL — ABNORMAL LOW (ref 13.0–17.0)
O2 Saturation: 58 %
Potassium: 2.8 mmol/L — ABNORMAL LOW (ref 3.5–5.1)
Sodium: 142 mmol/L (ref 135–145)
TCO2: 24 mmol/L (ref 22–32)
pCO2, Ven: 35.8 mm[Hg] — ABNORMAL LOW (ref 44–60)
pH, Ven: 7.415 (ref 7.25–7.43)
pO2, Ven: 30 mm[Hg] — CL (ref 32–45)

## 2023-09-09 LAB — BASIC METABOLIC PANEL
Anion gap: 13 (ref 5–15)
BUN: 31 mg/dL — ABNORMAL HIGH (ref 8–23)
CO2: 24 mmol/L (ref 22–32)
Calcium: 9.2 mg/dL (ref 8.9–10.3)
Chloride: 101 mmol/L (ref 98–111)
Creatinine, Ser: 1.29 mg/dL — ABNORMAL HIGH (ref 0.61–1.24)
GFR, Estimated: 55 mL/min — ABNORMAL LOW (ref >60)
Glucose, Bld: 102 mg/dL — ABNORMAL HIGH (ref 70–99)
Potassium: 3.9 mmol/L (ref 3.5–5.1)
Sodium: 138 mmol/L (ref 135–145)

## 2023-09-09 LAB — BRAIN NATRIURETIC PEPTIDE
B Natriuretic Peptide: 2582.6 pg/mL — ABNORMAL HIGH (ref 0.0–100.0)
B Natriuretic Peptide: 2453.7 pg/mL — ABNORMAL HIGH (ref 0.0–100.0)

## 2023-09-09 LAB — MAGNESIUM: Magnesium: 2.1 mg/dL (ref 1.7–2.4)

## 2023-09-09 SURGERY — RIGHT/LEFT HEART CATH AND CORONARY ANGIOGRAPHY
Anesthesia: LOCAL

## 2023-09-09 MED ORDER — LIDOCAINE HCL (PF) 1 % IJ SOLN
INTRAMUSCULAR | Status: AC
Start: 2023-09-09 — End: ?
  Filled 2023-09-09: qty 30

## 2023-09-09 MED ORDER — LIDOCAINE HCL (PF) 1 % IJ SOLN
INTRAMUSCULAR | Status: DC | PRN
  Administered 2023-09-09 (×2): 2 mL

## 2023-09-09 MED ORDER — FENTANYL CITRATE (PF) 100 MCG/2ML IJ SOLN
INTRAMUSCULAR | Status: DC | PRN
  Administered 2023-09-09: 25 ug via INTRAVENOUS

## 2023-09-09 MED ORDER — SODIUM CHLORIDE 0.9 % IV SOLN: 250.0000 mL | INTRAVENOUS | Status: AC | PRN

## 2023-09-09 MED ORDER — FENTANYL CITRATE (PF) 100 MCG/2ML IJ SOLN
INTRAMUSCULAR | Status: AC
  Filled 2023-09-09: qty 2

## 2023-09-09 MED ORDER — ASPIRIN 81 MG PO CHEW
81.0000 mg | CHEWABLE_TABLET | Freq: Every day | ORAL | Status: DC
  Administered 2023-09-09 – 2023-09-11 (×3): 81 mg via ORAL
  Filled 2023-09-09 (×3): qty 1

## 2023-09-09 MED ORDER — MIDAZOLAM HCL 2 MG/2ML IJ SOLN
INTRAMUSCULAR | Status: AC
  Filled 2023-09-09: qty 2

## 2023-09-09 MED ORDER — ISOSORB DINITRATE-HYDRALAZINE 20-37.5 MG PO TABS
1.0000 | ORAL_TABLET | Freq: Three times a day (TID) | ORAL | Status: DC
  Administered 2023-09-09 (×3): 1 via ORAL
  Filled 2023-09-09 (×3): qty 1

## 2023-09-09 MED ORDER — SODIUM CHLORIDE 0.9% FLUSH: 3.0000 mL | INTRAVENOUS | Status: DC | PRN

## 2023-09-09 MED ORDER — ENOXAPARIN SODIUM 40 MG/0.4ML IJ SOSY
40.0000 mg | PREFILLED_SYRINGE | INTRAMUSCULAR | Status: DC
  Administered 2023-09-10: 40 mg via SUBCUTANEOUS
  Filled 2023-09-09: qty 0.4

## 2023-09-09 MED ORDER — HEPARIN (PORCINE) IN NACL 1000-0.9 UT/500ML-% IV SOLN
INTRAVENOUS | Status: DC | PRN
  Administered 2023-09-09 (×2): 500 mL

## 2023-09-09 MED ORDER — VERAPAMIL HCL 2.5 MG/ML IV SOLN
INTRAVENOUS | Status: AC
Start: 2023-09-09 — End: ?
  Filled 2023-09-09: qty 2

## 2023-09-09 MED ORDER — SODIUM CHLORIDE 0.9 % IV SOLN: INTRAVENOUS | Status: DC

## 2023-09-09 MED ORDER — HYDRALAZINE HCL 20 MG/ML IJ SOLN
10.0000 mg | INTRAMUSCULAR | Status: AC | PRN
Start: 2023-09-09 — End: 2023-09-09

## 2023-09-09 MED ORDER — HEPARIN SODIUM (PORCINE) 1000 UNIT/ML IJ SOLN
INTRAMUSCULAR | Status: AC
Start: 2023-09-09 — End: ?
  Filled 2023-09-09: qty 10

## 2023-09-09 MED ORDER — MIDAZOLAM HCL 2 MG/2ML IJ SOLN
INTRAMUSCULAR | Status: DC | PRN
  Administered 2023-09-09: .5 mg via INTRAVENOUS

## 2023-09-09 MED ORDER — VERAPAMIL HCL 2.5 MG/ML IV SOLN
INTRAVENOUS | Status: DC | PRN
  Administered 2023-09-09: 10 mL via INTRA_ARTERIAL

## 2023-09-09 MED ORDER — IOHEXOL 350 MG/ML SOLN
INTRAVENOUS | Status: DC | PRN
  Administered 2023-09-09: 50 mL

## 2023-09-09 MED ORDER — SODIUM CHLORIDE 0.9% FLUSH
3.0000 mL | Freq: Two times a day (BID) | INTRAVENOUS | Status: DC
Start: 2023-09-09 — End: 2023-09-11
  Administered 2023-09-09 – 2023-09-11 (×3): 3 mL via INTRAVENOUS

## 2023-09-09 SURGICAL SUPPLY — 13 items
CATH 5FR JL3.5 JR4 ANG PIG MP (CATHETERS) ×1
CATH BALLN WEDGE 5F 110CM (CATHETERS) ×1
CATH INFINITI 5FR AL1 (CATHETERS) ×1
CATH INFINITI JR4 5F (CATHETERS) ×1
CATH LAUNCHER 6FR EBU3.5 (CATHETERS) ×1
DEVICE RAD COMP TR BAND LRG (VASCULAR PRODUCTS) ×1
GLIDESHEATH SLEND SS 6F .021 (SHEATH) ×1
GUIDEWIRE .025 260CM (WIRE) ×1
INQWIRE 1.5J .035X260CM (WIRE) ×1
PACK CARDIAC CATHETERIZATION (CUSTOM PROCEDURE TRAY) ×1
SET ATX-X65L (MISCELLANEOUS) ×2
SHEATH 6FR 85 DEST SLENDER (SHEATH) ×1
SHEATH GLIDE SLENDER 4/5FR (SHEATH) ×1

## 2023-09-09 NOTE — Progress Notes (Signed)
Patient arrived to room 6E28, VS stable and free from pain. Patient oriented to room and call bell in reach.

## 2023-09-09 NOTE — Progress Notes (Signed)
Patient to cath lab. Patient denies complaints. Consent signed. Unable to get 2nd iv started.

## 2023-09-09 NOTE — Progress Notes (Signed)
Heart Failure Nurse Navigator Progress Note  PCP: [facility] PCP-Cardiologist: Hochrein Admission Diagnosis: Acute on Chronic congestive heart failure, shortness of breath.  Admitted from: Home  Presentation:   Lucas Davis presented with generalized weakness, dizziness, shortness of breath, mild chest pressure, BP 139/100, HR 79, RR 40's, BNP 2,474, laced on BiPAP, CXR with stable mild cardiomegaly.   Patient educated on the sign and symptoms of heart failure, when to call his doctor or go to the ED, Diet/ fluid restrictions, taking all medications as prescribed and attending all medical appointments. Patient verbalized his understanding of education, a HF TOC appointment was scheduled for 09/23/2023 @ 12 noon.   ECHO/ LVEF: 20-25%  Clinical Course:  Past Medical History:  Diagnosis Date   Arthritis    Bilateral impacted cerumen 09/07/2016   COVID-19 06/30/2023   GERD (gastroesophageal reflux disease)    Hyperlipidemia    Hypertension    Neuropathy    Prostate cancer (HCC)    Type 2 diabetes mellitus (HCC) 09/07/2023     Social History   Socioeconomic History   Marital status: Married    Spouse name: Not on file   Number of children: Not on file   Years of education: Not on file   Highest education level: Not on file  Occupational History   Not on file  Tobacco Use   Smoking status: Never   Smokeless tobacco: Never  Vaping Use   Vaping status: Never Used  Substance and Sexual Activity   Alcohol use: Yes    Comment: once a month per pt   Drug use: No   Sexual activity: Not on file  Other Topics Concern   Not on file  Social History Narrative   Lives with wife.  One child.   Two grands.     Social Determinants of Health   Financial Resource Strain: Not on file  Food Insecurity: No Food Insecurity (09/07/2023)   Hunger Vital Sign    Worried About Running Out of Food in the Last Year: Never true    Ran Out of Food in the Last Year: Never  true  Transportation Needs: No Transportation Needs (09/07/2023)   PRAPARE - Administrator, Civil Service (Medical): No    Lack of Transportation (Non-Medical): No  Physical Activity: Not on file  Stress: Not on file  Social Connections: Not on file   Education Assessment and Provision:  Detailed education and instructions provided on heart failure disease management including the following:  Signs and symptoms of Heart Failure When to call the physician Importance of daily weights Low sodium diet Fluid restriction Medication management Anticipated future follow-up appointments  Patient education given on each of the above topics.  Patient acknowledges understanding via teach back method and acceptance of all instructions.  Education Materials:  "Living Better With Heart Failure" Booklet, HF zone tool, & Daily Weight Tracker Tool.  Patient has scale at home: Yes Patient has pill box at home: Yes    High Risk Criteria for Readmission and/or Poor Patient Outcomes: Heart failure hospital admissions (last 6 months): 1  No Show rate: 0 Difficult social situation: No Demonstrates medication adherence: Yes Primary Language: English Literacy level: Reading, writing, and comprehension  Barriers of Care:   [facility] Diet/ fluid restrictions Daily weights  Considerations/Referrals:   Referral made to Heart Failure Pharmacist Stewardship: yes Referral made to Heart Failure CSW/NCM TOC: No Referral made to Heart & Vascular TOC clinic: Yes, 09/23/2023 @ 12 noon  Items for Follow-up on DC/TOC: Continued HF education Diet/ fluid restrictions/ daily weights Continued HF education   Rhae Hammock, BSN, RN Heart Failure Print production planner Chat Only

## 2023-09-09 NOTE — Progress Notes (Signed)
PT Cancellation Note  Patient Details Name: Lucas Davis MRN: [id number] DOB: [birth year]   Cancelled Treatment:    Reason Eval/Treat Not Completed: Patient at procedure or test/unavailable (Pt off the floor at cath lab. Will follow up if time allows)   [name] 09/09/2023, 1:43 PM

## 2023-09-09 NOTE — TOC Initial Note (Signed)
Transition of Care [facility]) - Initial/Assessment Note    Patient Details  Name: Lucas Davis MRN: [id number] Date of Birth: 09/06/1941  Transition of [facility]) CM/SW Contact:    Lawerance Sabal, RN Phone Number: 09/09/2023, 11:26 AM  Clinical Narrative:                 Sherron Monday w patient's wife Lafonda Mosses over the phone. She states that teyplan for the patient to return home at DC. They have DME, "well equipped" from previous knee surgery.  Patient is not active w any HH services, and she politely declines HH services states that she will continue to care for him at home.  She confirms that she has notified the [place] of his admission.  His PCP at the [facility] is r Burton Apley.  She is agreeable to DC meds getting filled through [facility] pharmacy  [place],Diana (Spouse) [phone]   Expected Discharge Plan: Home/Self Care Barriers to Discharge: Continued Medical Work up   Patient Goals and CMS Choice Patient states their goals for this hospitalization and ongoing recovery are:: to go home          Expected Discharge Plan and Services   Discharge Planning Services: CM Consult Post Acute Care Choice: NA Living arrangements for the past 2 months: Single Family Home                 DME Arranged: N/A         HH Arranged: NA          Prior Living Arrangements/Services Living arrangements for the past 2 months: Single Family Home Lives with:: Spouse                   Activities of Daily Living   ADL Screening (condition at time of admission) Independently performs ADLs?: Yes (appropriate for developmental age) Is the patient deaf or have difficulty hearing?: No Does the patient have difficulty seeing, even when wearing glasses/contacts?: No Does the patient have difficulty concentrating, remembering, or making decisions?: No  Permission Sought/Granted                  Emotional Assessment              Admission diagnosis:  Shortness of breath  [R06.02] Acute respiratory failure with hypoxia (HCC) [J96.01] Acute on chronic diastolic CHF (congestive heart failure) (HCC) [I50.33] Acute on chronic congestive heart failure, unspecified heart failure type (HCC) [I50.9] Patient Active Problem List   Diagnosis Date Noted   Acute combined systolic and diastolic heart failure (HCC) 09/09/2023   Demand ischemia (HCC) 09/09/2023   Acute on chronic diastolic CHF (congestive heart failure) (HCC) 09/08/2023   Primary osteoarthritis 09/07/2023   Presence of right artificial knee joint 09/07/2023   Idiopathic progressive neuropathy 09/07/2023   Essential tremor 09/07/2023   Combined forms of age-related cataract, left eye 09/07/2023   Type 2 diabetes mellitus (HCC) 09/07/2023   Acute respiratory failure with hypoxia (HCC) 09/07/2023   Hypotension due to drugs 06/30/2023   Pressure injury of skin 06/30/2023   Bradycardia 06/28/2023   Bilateral chronic knee pain 04/24/2021   Essential hypertension 10/10/2019   Dyslipidemia 10/10/2019   Educated about COVID-19 virus infection 10/10/2019   Sensorineural hearing loss (SNHL), bilateral 09/07/2016   Tinnitus of both ears 06/28/2016   Rheumatoid arthritis (HCC) 11/01/2010   Fatty liver 11/01/1998   PCP:  [facility]:   [facility] [phone] - [place], [place] - [address]  [address] [place] [address] Phone: [phone] Fax: [phone]     Social Determinants of Health (SDOH) Social History: SDOH Screenings   Food Insecurity: No Food Insecurity (09/07/2023)  Housing: Patient Unable To Answer (09/07/2023)  Transportation Needs: No Transportation Needs (09/07/2023)  Utilities: Not At Risk (09/07/2023)  Recent Concern: Utilities - At Risk (06/29/2023)  Depression (PHQ2-9): Low Risk  (05/01/2021)  Tobacco Use: Low Risk  (09/07/2023)   SDOH Interventions:     Readmission Risk Interventions     No data to  display

## 2023-09-09 NOTE — Progress Notes (Signed)
PROGRESS NOTE                                                                                                                                                                                                             Patient Demographics:    Lucas Davis, is a 82 y.o. male, DOB - 04-02-1941, VFI:[id number]  Outpatient Primary MD for the patient is [facility]    LOS - 1  Admit date - 09/07/2023    Chief Complaint  Patient presents with   Respiratory Distress   Weakness       Brief Narrative (HPI from H&P)   82 year old M with PMH of diastolic CHF, RA, HTN, DM-2, HLD, osteoarthritis, tremors, fatty liver and diminished hearing presenting with shortness of breath for about 2 to 3 weeks that has acutely gotten worse for 2 days prior to presentation with associated orthopnea, night cough and edema, and admitted for acute respiratory failure with hypoxia in the setting of acute on chronic diastolic CHF.  Patient is not on diuretics at home.  He was admitted for acute on chronic systolic CHF.    Subjective:    Lucas Davis today has, No headache, No chest pain, No abdominal pain - No Nausea, No new weakness tingling or numbness, no SOB   Assessment  & Plan :    Acute on chronic bind systolic and diastolic CHF with now 25%: Presents with progressive dyspnea, orthopnea, cough and edema.  BNP > 2400.  Mildly elevated troponin without significant delta.  EKG with sinus rhythm with first-degree AVB and PVCs.  For now shows EF of 20 to 25% with wall motion abnormality, thankfully he has responded very well to diuretics, much improved.  Will defer diuretics to cardiology, he is eager to go home, may require catheterization this admission versus outpatient deferred to cardiology.  Monitor renal function closely on ongoing diuresis.  Advance activity and titrate off oxygen.   Uncontrolled hypertension: Add BiDil along  with diuretics and monitor.   First-degree AV block/frequent PVCs:  -Stable TSH, no beta-blockers due to history of symptomatic bradycardia requiring ICU admission and dopamine infusion, cardiology on board continue to monitor   Elevated troponin/type II MI due to acute CHF.  EKG without acute ischemic finding, echo noted cardiology on board.  No beta-blocker due to history of severe bradycardia.  On statin, will add low-dose aspirin and monitor    History of rheumatoid arthritis: Not on DMARDs. Osteoarthritis -Hold home Celebrex -Tylenol as needed   Essential tremor -Propranolol was discontinued after admission in the summer for symptomatic bradycardia for which he required ICU admission and dopamine infusion.   GERD -Continue PPI    NIDDM-2 with hyperglycemia: A1c 6.5% in 06/2023.  Does not seem to be on medication at home.  ISS  Lab Results  Component Value Date   HGBA1C 6.5 (H) 06/28/2023   CBG (last 3)  Recent Labs    09/08/23 1256 09/08/23 1616 09/09/23 0226  GLUCAP 168* 113* 103*           Condition - Extremely Guarded  Family Communication  : Wife Lupita Leash [phone] on 09/09/2023 at 7:27 AM.  Message left  Code Status :  Full  Consults  :  Cards  PUD Prophylaxis :    Procedures  :     TTE - 1. Left ventricular ejection fraction, by estimation, is 20 to 25%. The left ventricle has severely decreased function. The left ventricle demonstrates global hypokinesis. The left ventricular internal cavity size was mildly to moderately dilated. Left ventricular diastolic function could not be evaluated.  2. Right ventricular systolic function is moderately reduced. The right ventricular size is normal. Tricuspid regurgitation signal is inadequate for assessing PA pressure.  3. Left atrial size was moderately dilated.  4. A small pericardial effusion is present.  5. The mitral valve is degenerative. Trivial mitral valve regurgitation. Moderate mitral annular calcification.   6. The aortic valve is tricuspid. There is mild calcification of the aortic valve.  7. The inferior vena cava is dilated in size with <50% respiratory variability, suggesting right atrial pressure of 15 mmHg.      Disposition Plan  :    Status is: Inpatient  DVT Prophylaxis  :    enoxaparin (LOVENOX) injection 40 mg Start: 09/07/23 1645   Lab Results  Component Value Date   PLT 270 09/09/2023    Diet :  Diet Order             Diet heart healthy/carb modified Room service appropriate? Yes; Fluid consistency: Thin  Diet effective now                    Inpatient Medications  Scheduled Meds:  enoxaparin (LOVENOX) injection  40 mg Subcutaneous Q24H   furosemide  40 mg Intravenous BID   insulin aspart  0-9 Units Subcutaneous TID WC   pantoprazole  40 mg Oral Daily   polyvinyl alcohol  1 drop Both Eyes BID   pravastatin  40 mg Oral QPM   sodium chloride flush  3 mL Intravenous Q12H   Continuous Infusions: PRN Meds:.acetaminophen **OR** acetaminophen, LORazepam, polyethylene glycol  Antibiotics  :    Anti-infectives (From admission, onward)    None         Objective:   Vitals:   09/08/23 2138 09/09/23 0055 09/09/23 0425 09/09/23 0430  BP: 132/89 (!) 155/85  129/66  Pulse: 75   (!) 40  Resp:  (!) 24  14  Temp:  98.5 F (36.9 C) 98 F (36.7 C) 98 F (36.7 C)  TempSrc:  Oral Oral Oral  SpO2:  97%  93%  Weight:      Height:        Wt Readings from Last 3 Encounters:  09/07/23 86.2 kg  07/02/23 86.6 kg  05/22/21 84.4 kg  Intake/Output Summary (Last 24 hours) at 09/09/2023 0727 Last data filed at 09/08/2023 2300 Gross per 24 hour  Intake 360 ml  Output 700 ml  Net -340 ml     Physical Exam  Awake Alert, No new F.N deficits, Normal affect [place].AT,PERRAL Supple Neck, No JVD,   Symmetrical Chest wall movement, Good air movement bilaterally, CTAB RRR,No Gallops,Rubs or new Murmurs,  +ve B.Sounds, Abd Soft, No tenderness,   No Cyanosis,  Clubbing or edema     RN pressure injury documentation: Pressure Injury 06/28/23 Sacrum Stage 1 -  Intact skin with non-blanchable redness of a localized area usually over a bony prominence. (Active)  06/28/23 1400  Location: Sacrum  Location Orientation:   Staging: Stage 1 -  Intact skin with non-blanchable redness of a localized area usually over a bony prominence.  Wound Description (Comments):   Present on Admission: Yes      Data Review:    Recent Labs  Lab 09/07/23 1328 09/07/23 1629 09/08/23 0515 09/09/23 0455  WBC 7.5  --  7.7 6.9  HGB 13.7 14.3 13.5 13.7  HCT 42.9 42.0 41.1 42.8  PLT 271  --  225 270  MCV 89.6  --  89.2 90.1  MCH 28.6  --  29.3 28.8  MCHC 31.9  --  32.8 32.0  RDW 14.8  --  14.7 14.8  LYMPHSABS 2.1  --   --  2.4  MONOABS 0.8  --   --  0.8  EOSABS 0.0  --   --  0.1  BASOSABS 0.0  --   --  0.0    Recent Labs  Lab 09/07/23 1328 09/07/23 1404 09/07/23 1405 09/07/23 1629 09/08/23 0638 09/08/23 0730 09/08/23 1058 09/09/23 0445 09/09/23 0455  NA 136  --   --  137 139  --   --   --  138  K 4.0  --   --  4.2 4.1  --   --   --  3.9  CL 100  --   --   --  101  --   --   --  101  CO2 24  --   --   --  21*  --   --   --  24  ANIONGAP 12  --   --   --  17*  --   --   --  13  GLUCOSE 115*  --   --   --  103*  --   --   --  102*  BUN 27*  --   --   --  24*  --   --   --  31*  CREATININE 1.10  --   --   --  1.22  --   --   --  1.29*  AST 32  --   --   --  40  --   --   --   --   ALT 29  --   --   --  29  --   --   --   --   ALKPHOS 61  --   --   --  60  --   --   --   --   BILITOT 1.5*  --   --   --  1.9*  --   --   --   --   ALBUMIN 3.6  --   --   --  3.7  --   --   --   --  DDIMER  --   --  0.56*  --   --   --   --   --   --   TSH  --   --   --   --   --   --  2.030  --   --   BNP  --  2,474.7*  --   --   --   --   --  2,582.6*  --   MG 2.0  --   --   --   --  2.0  --   --  2.1  CALCIUM 9.3  --   --   --  9.2  --   --   --  9.2       Recent Labs  Lab 09/07/23 1328 09/07/23 1404 09/07/23 1405 09/08/23 0638 09/08/23 0730 09/08/23 1058 09/09/23 0445 09/09/23 0455  DDIMER  --   --  0.56*  --   --   --   --   --   TSH  --   --   --   --   --  2.030  --   --   BNP  --  2,474.7*  --   --   --   --  2,582.6*  --   MG 2.0  --   --   --  2.0  --   --  2.1  CALCIUM 9.3  --   --  9.2  --   --   --  9.2     Radiology Reports ECHOCARDIOGRAM LIMITED  Result Date: 09/08/2023    ECHOCARDIOGRAM REPORT   Patient Name:   Lucas Davis Date of Exam: 09/08/2023 Medical Rec #:  [id number]       Height:       67.0 in Accession #:    [id number]      Weight:       190.0 lb Date of Birth:  [birth year]       BSA:          1.979 m Patient Age:    82 years        BP:           169/106 mmHg Patient Gender: M               HR:           89 bpm. Exam Location:  Inpatient Procedure: Limited Echo, Cardiac Doppler, Color Doppler and Intracardiac            Opacification Agent Indications:     Dyspnea  History:         Patient has prior history of Echocardiogram examinations, most                  recent 06/28/2023. Risk Factors:Hypertension, Diabetes and                  Dyslipidemia.  Sonographer:     Harriette Bouillon RDCS Referring Phys:  [id number] Cecille Po MELVIN Diagnosing Phys: Weston Brass MD IMPRESSIONS  1. Left ventricular ejection fraction, by estimation, is 20 to 25%. The left ventricle has severely decreased function. The left ventricle demonstrates global hypokinesis. The left ventricular internal cavity size was mildly to moderately dilated. Left ventricular diastolic function could not be evaluated.  2. Right ventricular systolic function is moderately reduced. The right ventricular size is normal. Tricuspid regurgitation signal is inadequate for assessing PA pressure.  3. Left atrial size was moderately dilated.  4. A small pericardial effusion is present.  5. The mitral valve is degenerative. Trivial mitral valve regurgitation. Moderate mitral  annular calcification.  6. The aortic valve is tricuspid. There is mild calcification of the aortic valve.  7. The inferior vena cava is dilated in size with <50% respiratory variability, suggesting right atrial pressure of 15 mmHg. FINDINGS  Left Ventricle: Left ventricular ejection fraction, by estimation, is 20 to 25%. The left ventricle has severely decreased function. The left ventricle demonstrates global hypokinesis. The left ventricular internal cavity size was mildly to moderately dilated. Suboptimal image quality limits for assessment of left ventricular hypertrophy. Left ventricular diastolic function could not be evaluated. Right Ventricle: The right ventricular size is normal. No increase in right ventricular wall thickness. Right ventricular systolic function is moderately reduced. Tricuspid regurgitation signal is inadequate for assessing PA pressure. Left Atrium: Left atrial size was moderately dilated. Right Atrium: Right atrial size was normal in size. Pericardium: A small pericardial effusion is present. Mitral Valve: The mitral valve is degenerative in appearance. Moderate mitral annular calcification. Trivial mitral valve regurgitation. Tricuspid Valve: The tricuspid valve is grossly normal. Tricuspid valve regurgitation is trivial. Aortic Valve: The aortic valve is tricuspid. There is mild calcification of the aortic valve. Pulmonic Valve: The pulmonic valve was grossly normal. Aorta: The aortic root is normal in size and structure. Venous: The inferior vena cava is dilated in size with less than 50% respiratory variability, suggesting right atrial pressure of 15 mmHg. IAS/Shunts: No atrial level shunt detected by color flow Doppler.  LEFT VENTRICLE PLAX 2D LVIDd:         6.40 cm LVIDs:         5.60 cm LV PW:         0.90 cm LV IVS:        0.90 cm  IVC IVC diam: 2.50 cm LEFT ATRIUM         Index LA diam:    5.90 cm 2.98 cm/m Weston Brass MD Electronically signed by Weston Brass MD  Signature Date/Time: 09/08/2023/2:26:54 PM    Final (Updated)    DG Chest Portable 1 View  Result Date: 09/07/2023 CLINICAL DATA:  SOB, tachypnea. EXAM: PORTABLE CHEST 1 VIEW COMPARISON:  06/28/2023. FINDINGS: There are left retrocardiac airspace opacities obscuring the left hemidiaphragm and descending thoracic aorta suggesting combination of left lower lobe atelectasis and/or consolidation. Bilateral lung fields are otherwise clear. Bilateral costophrenic angles are clear. Stable mildly enlarged cardio-mediastinal silhouette. Aortic arch calcifications noted. No acute osseous abnormalities. The soft tissues are within normal limits. IMPRESSION: *Stable mild cardiomegaly. *Left retrocardiac opacity, as described above. Electronically Signed   By: Jules Schick M.D.   On: 09/07/2023 16:46      Signature  -   Susa Raring M.D on 09/09/2023 at 7:27 AM   -  To page go to [email or web address]

## 2023-09-09 NOTE — Progress Notes (Signed)
TR BAND REMOVAL  LOCATION:    radial rt radial  DEFLATED PER PROTOCOL:   yes  TIME BAND OFF / DRESSING APPLIED:    1615/gauze and tegaderm  SITE UPON ARRIVAL:    Level 0  SITE AFTER BAND REMOVAL:    Level 0  CIRCULATION SENSATION AND MOVEMENT:    Within Normal Limits : yes, rt hand and fingers pink, good pleth waveform, palpable rt radial pulse  COMMENTS:

## 2023-09-09 NOTE — Interval H&P Note (Signed)
History and Physical Interval Note:  09/09/2023 1:01 PM  YISSOCHOR DIBBEN  has presented today for surgery, with the diagnosis of heart failure.  The various methods of treatment have been discussed with the patient and family. After consideration of risks, benefits and other options for treatment, the patient has consented to  Procedure(s): RIGHT/LEFT HEART CATH AND CORONARY ANGIOGRAPHY (N/A) as a surgical intervention.  The patient's history has been reviewed, patient examined, no change in status, stable for surgery.  I have reviewed the patient's chart and labs.  Questions were answered to the patient's satisfaction.    Cath Lab Visit (complete for each Cath Lab visit)  Clinical Evaluation Leading to the Procedure:   ACS: Yes.    Non-ACS:    Anginal Classification: No Symptoms  Anti-ischemic medical therapy: No Therapy  Non-Invasive Test Results: No non-invasive testing performed  Prior CABG: No previous CABG        Verne Carrow

## 2023-09-09 NOTE — Progress Notes (Signed)
   Heart Failure Stewardship Pharmacist Progress Note   PCP: [facility] PCP-Cardiologist: Rollene Rotunda, MD    HPI:  82 yo M with PMH of HTN, HLD, diabetes, hearing loss, rheumatoid arthritis, osteoarthritis, fatty liver, neuropathy, essential tremor, and cataracts.   Presented to the ED on 11/6 with shortness of breath, orthopnea, cough, generalized weakness, and dizziness. Placed on BiPAP in the ED. CXR with stable mild cardiomegaly. BNP 2453. ECHO with LVEF 20-25% (was 55-60% in 06/2023), global hypokinesis, RV moderately reduced, small pericardial effusion, trivial MR.   Reports improvement in symptoms and volume status. Scheduled for [facility] later today.   Current HF Medications: Diuretic: furosemide 40 mg IV BID Other: BiDil 20/37.5 mg 1 tab TID  Prior to admission HF Medications: None  Pertinent Lab Values: Serum creatinine 1.29, BUN 31, Potassium 3.9, Sodium 138, BNP 2453.7, Magnesium 2.1, A1c 6.5   Vital Signs: Weight: 190 lbs (admission weight: 190 lbs) Blood pressure: 110-150/60s  Heart rate: 50-70s  I/O: net -0.3L yesterday; net -0.1L since admission (incomplete)  Medication Assistance / Insurance Benefits Check: Does the patient have prescription insurance?  Yes Type of insurance plan: [employer]  Outpatient Pharmacy:  Prior to admission outpatient [facility] Is the patient willing to use [facility] TOC pharmacy at discharge? Yes Is the patient willing to transition their outpatient pharmacy to utilize a [facility] outpatient pharmacy?   Pending    Assessment: 1. Acute on chronic systolic CHF (LVEF 20-25%), pending R/LHC today. NYHA class II symptoms. - Volume status improving on furosemide 40 mg IV BID. R/LHC today. Strict I/Os and daily weights. Keep K>4 and Mg>2. - No BB with history of bradycardia - Further GDMT after cath today - Continue BiDil 20/27.5 mg 1 tab TID   Plan: 1) Medication changes recommended at this time: - None pending R/LHC  findings  2) Patient assistance: - Sherryll Burger and [place] Pepper available with [facility] formulary  3)  Education  - To be completed prior to discharge  Sharen Hones, PharmD, BCPS Heart Failure Stewardship Pharmacist Phone [phone]

## 2023-09-09 NOTE — Progress Notes (Signed)
Patient to cath lab for cardiac cath.

## 2023-09-09 NOTE — Progress Notes (Signed)
   Patient Name: Lucas Davis Date of Encounter: 09/09/2023 [facility] Cardiologist: Rollene Rotunda, MD   Interval Summary  .    Patient reports that he's feeling very well today. Confirms significant urine output with diuresis yesterday. He denies chest pain, shortness of breath, palpitations, lightheadedness.   Vital Signs .    Vitals:   09/09/23 0055 09/09/23 0425 09/09/23 0430 09/09/23 0800  BP: (!) 155/85  129/66   Pulse:   (!) 40   Resp: (!) 24  14 (!) 24  Temp: 98.5 F (36.9 C) 98 F (36.7 C) 98 F (36.7 C)   TempSrc: Oral Oral Oral   SpO2: 97%  93%   Weight:      Height:        Intake/Output Summary (Last 24 hours) at 09/09/2023 1000 Last data filed at 09/08/2023 2300 Gross per 24 hour  Intake 360 ml  Output 700 ml  Net -340 ml      09/07/2023    2:19 PM 07/02/2023    4:30 AM 07/01/2023    4:17 AM  Last 3 Weights  Weight (lbs) 190 lb 190 lb 14.7 oz 191 lb 9.3 oz  Weight (kg) 86.183 kg 86.6 kg 86.9 kg      Telemetry/ECG    Sinus arrhythmia with PVCs, Long PR interval - Personally Reviewed  Physical Exam .   GEN: No acute distress.   Neck: No JVD Cardiac: RRR, no murmurs, rubs, or gallops.  Respiratory: lower lobes diminished bilaterally  GI: Soft, nontender, non-distended  MS: No edema  Assessment & Plan .    CHARLEMAGNE HOUTZ is a 82 y.o. male with a hx of hypertension, hyperlipidemia, symptomatic bradycardia, fatty liver disease, RA/OA, GERD, essential tremor who is being seen for the evaluation of CHF exacerbation.    Acute hypoxic respiratory failure Acute on chronic congestive heart failure with newly reduced LVEF  Patient admitted with acute hypoxic respiratory failure requiring brief support with BiPAP. Has had worsening dyspnea and leg edema over the past several weeks. TTE on 11/7 shows LVEF 20-25% with global hypokinesis, down from 55-60% as of August 2024.   Patient's respiratory status has continued to improve with diuresis, IV  lasix 40mg  BID. I/O are not accurately tracked. Rising creatinine and BUN today suggest patient approaching euvolemia. Use  Will plan for LHC/RHC today to further assess change in LV function. Following this, he will need GDMT optimization with ARB vs Entresto as well as SGLT2 and spironolactone as renal function allows.   Hx of symptomatic bradycardia EKG with ?retrograde p waves vs 2:1 heart block  Patient admitted in August 2024 with symptomatic bradycardia, felt to be secondary to propranolol. He was off all AV nodal agents following this admission though did receive IV lopressor this current admission.    Stable sinus rhythm now back off Lopressor Continue to avoid AV nodal agents  Elevated troponin   HS troponin this admission 120->91. While this is likely demand ischemia in the setting of acute hypoxic respiratory failure, given significant LVEF change on echo, will need LHC/RHC as above.   Hyperlipidemia  No recent lipid panel data available. Continue Pravastatin. If found to have significant CAD on LHC, would transition to high intensity statin.    For questions or updates, please contact [facility] Please consult [email or web address] for contact info under        Signed, Perlie Gold, PA-C

## 2023-09-10 DIAGNOSIS — I2510 Atherosclerotic heart disease of native coronary artery without angina pectoris: Secondary | ICD-10-CM

## 2023-09-10 DIAGNOSIS — I509 Heart failure, unspecified: Secondary | ICD-10-CM

## 2023-09-10 DIAGNOSIS — E785 Hyperlipidemia, unspecified: Secondary | ICD-10-CM | POA: Diagnosis not present

## 2023-09-10 DIAGNOSIS — J9601 Acute respiratory failure with hypoxia: Secondary | ICD-10-CM | POA: Diagnosis not present

## 2023-09-10 LAB — BASIC METABOLIC PANEL WITH GFR
Anion gap: 12 (ref 5–15)
BUN: 26 mg/dL — ABNORMAL HIGH (ref 8–23)
CO2: 27 mmol/L (ref 22–32)
Calcium: 9 mg/dL (ref 8.9–10.3)
Chloride: 102 mmol/L (ref 98–111)
Creatinine, Ser: 1.26 mg/dL — ABNORMAL HIGH (ref 0.61–1.24)
GFR, Estimated: 57 mL/min — ABNORMAL LOW
Glucose, Bld: 81 mg/dL (ref 70–99)
Potassium: 4 mmol/L (ref 3.5–5.1)
Sodium: 141 mmol/L (ref 135–145)

## 2023-09-10 LAB — CBC WITH DIFFERENTIAL/PLATELET
Abs Immature Granulocytes: 0.03 10*3/uL (ref 0.00–0.07)
Basophils Absolute: 0 10*3/uL (ref 0.0–0.1)
Basophils Relative: 0 %
Eosinophils Absolute: 0.1 10*3/uL (ref 0.0–0.5)
Eosinophils Relative: 2 %
HCT: 37.8 % — ABNORMAL LOW (ref 39.0–52.0)
Hemoglobin: 12.3 g/dL — ABNORMAL LOW (ref 13.0–17.0)
Immature Granulocytes: 0 %
Lymphocytes Relative: 27 %
Lymphs Abs: 2.2 10*3/uL (ref 0.7–4.0)
MCH: 28.9 pg (ref 26.0–34.0)
MCHC: 32.5 g/dL (ref 30.0–36.0)
MCV: 88.7 fL (ref 80.0–100.0)
Monocytes Absolute: 1 10*3/uL (ref 0.1–1.0)
Monocytes Relative: 13 %
Neutro Abs: 4.7 10*3/uL (ref 1.7–7.7)
Neutrophils Relative %: 58 %
Platelets: 231 10*3/uL (ref 150–400)
RBC: 4.26 MIL/uL (ref 4.22–5.81)
RDW: 14.9 % (ref 11.5–15.5)
WBC: 8.1 10*3/uL (ref 4.0–10.5)
nRBC: 0 % (ref 0.0–0.2)

## 2023-09-10 LAB — GLUCOSE, CAPILLARY
Glucose-Capillary: 75 mg/dL (ref 70–99)
Glucose-Capillary: 121 mg/dL — ABNORMAL HIGH (ref 70–99)
Glucose-Capillary: 91 mg/dL (ref 70–99)
Glucose-Capillary: 110 mg/dL — ABNORMAL HIGH (ref 70–99)

## 2023-09-10 LAB — MAGNESIUM: Magnesium: 1.9 mg/dL (ref 1.7–2.4)

## 2023-09-10 LAB — BRAIN NATRIURETIC PEPTIDE: B Natriuretic Peptide: 1471.7 pg/mL — ABNORMAL HIGH (ref 0.0–100.0)

## 2023-09-10 MED ORDER — ROSUVASTATIN CALCIUM 20 MG PO TABS
40.0000 mg | ORAL_TABLET | Freq: Every day | ORAL | Status: DC
  Administered 2023-09-10: 40 mg via ORAL
  Filled 2023-09-10: qty 2

## 2023-09-10 MED ORDER — FUROSEMIDE 10 MG/ML IJ SOLN
20.0000 mg | Freq: Two times a day (BID) | INTRAMUSCULAR | Status: DC
  Administered 2023-09-10 – 2023-09-11 (×3): 20 mg via INTRAVENOUS
  Filled 2023-09-10 (×3): qty 2

## 2023-09-10 MED ORDER — EMPAGLIFLOZIN 10 MG PO TABS
10.0000 mg | ORAL_TABLET | Freq: Every day | ORAL | Status: DC
  Administered 2023-09-10 – 2023-09-11 (×2): 10 mg via ORAL
  Filled 2023-09-10 (×2): qty 1

## 2023-09-10 MED ORDER — POLYETHYLENE GLYCOL 3350 17 G PO PACK
17.0000 g | PACK | Freq: Every day | ORAL | Status: DC
  Administered 2023-09-10 – 2023-09-11 (×2): 17 g via ORAL
  Filled 2023-09-10 (×2): qty 1

## 2023-09-10 MED ORDER — SPIRONOLACTONE 12.5 MG HALF TABLET
12.5000 mg | ORAL_TABLET | Freq: Every day | ORAL | Status: DC
  Administered 2023-09-10: 12.5 mg via ORAL
  Filled 2023-09-10: qty 1

## 2023-09-10 NOTE — Progress Notes (Signed)
Patient Name: Lucas Davis Date of Encounter: 09/10/2023 [facility] Cardiologist: Rollene Rotunda, MD   Interval Summary  .    Breathing is better today. Net negative 1.6L overnight- continue IV lasix. R/LHC yesterday showed moderate non-obstructive CAD with elevated R/L heart pressures, CO 4.7, CI 2.38.  Creatinine improving and BNP is coming down.  Vital Signs .    Vitals:   09/09/23 2102 09/10/23 0015 09/10/23 0509 09/10/23 0758  BP: 119/84 106/67 132/61 126/71  Pulse: 60 (!) 54 (!) 58 61  Resp: (!) 21 18 18 20   Temp:  98.3 F (36.8 C) 98.3 F (36.8 C) 98.3 F (36.8 C)  TempSrc:  Oral Oral Oral  SpO2:  95% 99% 92%  Weight:   75.2 kg   Height:        Intake/Output Summary (Last 24 hours) at 09/10/2023 1043 Last data filed at 09/10/2023 0021 Gross per 24 hour  Intake 240 ml  Output 2100 ml  Net -1860 ml      09/10/2023    5:09 AM 09/07/2023    2:19 PM 07/02/2023    4:30 AM  Last 3 Weights  Weight (lbs) 165 lb 12.8 oz 190 lb 190 lb 14.7 oz  Weight (kg) 75.206 kg 86.183 kg 86.6 kg      Telemetry/ECG    Sinus rhythm with PVC's - Personally Reviewed  Physical Exam .    General appearance: alert and no distress Neck: JVD - 5 cm above sternal notch, no carotid bruit, and thyroid not enlarged, symmetric, no tenderness/mass/nodules Lungs: clear to auscultation bilaterally Heart: regular rate and rhythm Abdomen: soft, non-tender; bowel sounds normal; no masses,  no organomegaly Extremities: edema trace Pulses: 2+ and symmetric Skin: Skin color, texture, turgor normal. No rashes or lesions Neurologic: Grossly normal Psych: Pleasant  Assessment & Plan .    FLINT ETTER is a 82 y.o. male with a hx of hypertension, hyperlipidemia, symptomatic bradycardia, fatty liver disease, RA/OA, GERD, essential tremor who is being seen for the evaluation of CHF exacerbation.    Acute hypoxic respiratory failure Acute on chronic congestive heart failure with newly  reduced LVEF  Patient admitted with acute hypoxic respiratory failure requiring brief support with BiPAP. Has had worsening dyspnea and leg edema over the past several weeks. TTE on 11/7 shows LVEF 20-25% with global hypokinesis, down from 55-60% as of August 2024.   Cath yesterday showed persistently elevated LV filling pressures, will need additional diuresis.  Continue GDMT optimization with ARB vs Entresto as well as SGLT2 and spironolactone as renal function allows.   Hx of symptomatic bradycardia EKG with ?retrograde p waves vs 2:1 heart block  Patient admitted in August 2024 with symptomatic bradycardia, felt to be secondary to propranolol. He was off all AV nodal agents following this admission though did receive IV lopressor this current admission.    Stable sinus rhythm, occasional PVC's Continue to avoid AV nodal agents  Elevated troponin   HS troponin this admission 120->91.  Cath showed moderate non-obstructive CAD  Hyperlipidemia  Change pravastatin to HIS - rosuvastatin 20 mg at bedtime.  Check FLP and LP(a) tomorrow am.   For questions or updates, please contact [facility] Please consult [email or web address] for contact info under   Chrystie Nose, MD, Milagros Loll    [facility]  Medical Director of the [facility]  [facility] Diplomate of the American Board of Clinical Lipidology Attending Cardiologist  Direct Dial:  [phone]  Fax: [phone]  Website:  [email or web address]  Chrystie Nose, MD

## 2023-09-10 NOTE — Evaluation (Signed)
Physical Therapy Evaluation Patient Details Name: Lucas Davis MRN: [id number] DOB: Dec 23, 1940 Today's Date: 09/10/2023  History of Present Illness  Pt is 82 year old presented to [facility] on  09/07/23 for orthopnea and edema. Pt with acute on chronic combined systolic and diastolic chf. Previously EF was preserved. No EF 20-25%. PMH - rt TKR July 2024, DM, HTN, RA, OA, bradycardia  Clinical Impression  Pt presents to PT with slightly unsteady gait due to illness and inactivity. Expect pt will make good progress back to baseline with mobility. Will follow acutely but doubt pt will need PT after DC.          If plan is discharge home, recommend the following:     Can travel by private vehicle        Equipment Recommendations None recommended by PT  Recommendations for Other Services       Functional Status Assessment Patient has had a recent decline in their functional status and demonstrates the ability to make significant improvements in function in a reasonable and predictable amount of time.     Precautions / Restrictions Precautions Precautions: Fall Restrictions Weight Bearing Restrictions: No      Mobility  Bed Mobility Overal bed mobility: Modified Independent                  Transfers Overall transfer level: Needs assistance Equipment used: None Transfers: Sit to/from Stand Sit to Stand: Supervision           General transfer comment: supervision for safety    Ambulation/Gait Ambulation/Gait assistance: Contact guard assist Gait Distance (Feet): 400 Feet Assistive device: None Gait Pattern/deviations: Step-through pattern, Decreased stride length, Drifts right/left Gait velocity: decr Gait velocity interpretation: 1.31 - 2.62 ft/sec, indicative of limited community ambulator   General Gait Details:   Stairs            Wheelchair Mobility     Tilt Bed    Modified Rankin (Stroke Patients Only)       Balance Overall balance  assessment: Needs assistance Sitting-balance support: No upper extremity supported, Feet supported Sitting balance-Leahy Scale: Good     Standing balance support: No upper extremity supported, During functional activity Standing balance-Leahy Scale: Fair                               Pertinent Vitals/Pain Pain Assessment Pain Assessment: No/denies pain    Home Living Family/patient expects to be discharged to:: Private residence Living Arrangements: Spouse/significant other Available Help at Discharge: Family;Available 24 hours/day Type of Home: House Home Access: Stairs to enter Entrance Stairs-Rails: Right Entrance Stairs-Number of Steps: 2   Home Layout: Two level;Able to live on main level with bedroom/bathroom Home Equipment: Rolling Walker (2 wheels);Cane - single point Additional Comments: Lives on main level of home    Prior Function Prior Level of Function : Independent/Modified Independent;Driving             Mobility Comments: no assistive device       Extremity/Trunk Assessment   Upper Extremity Assessment Upper Extremity Assessment: Overall WFL for tasks assessed    Lower Extremity Assessment Lower Extremity Assessment: Generalized weakness       Communication   Communication Communication: Hearing impairment  Cognition Arousal: Alert Behavior During Therapy: WFL for tasks assessed/performed Overall Cognitive Status: Within Functional Limits for tasks assessed  General Comments General comments (skin integrity, edema, etc.): VSS on RA    Exercises     Assessment/Plan    PT Assessment Patient needs continued PT services  PT Problem List Decreased strength;Decreased balance;Decreased mobility       PT Treatment Interventions DME instruction;Gait training;Stair training;Functional mobility training;Therapeutic activities;Therapeutic exercise;Balance training;Wheelchair  mobility training    PT Goals (Current goals can be found in the Care Plan section)  Acute Rehab PT Goals Patient Stated Goal: return home PT Goal Formulation: With patient Time For Goal Achievement: 09/24/23 Potential to Achieve Goals: Good    Frequency Min 1X/week     Co-evaluation               AM-PAC PT "6 Clicks" Mobility  Outcome Measure Help needed turning from your back to your side while in a flat bed without using bedrails?: None Help needed moving from lying on your back to sitting on the side of a flat bed without using bedrails?: None Help needed moving to and from a bed to a chair (including a wheelchair)?: A Little Help needed standing up from a chair using your arms (e.g., wheelchair or bedside chair)?: A Little Help needed to walk in hospital room?: A Little Help needed climbing 3-5 steps with a railing? : A Little 6 Click Score: 20    End of Session   Activity Tolerance: Patient tolerated treatment well Patient left: in chair;with call bell/phone within reach;with chair alarm set   PT Visit Diagnosis: Unsteadiness on feet (R26.81);Muscle weakness (generalized) (M62.81)    Time: 6962-9528 PT Time Calculation (min) (ACUTE ONLY): 24 min   Charges:   PT Evaluation $PT Eval Moderate Complexity: 1 Mod PT Treatments $Gait Training: 8-22 mins PT General Charges $$ ACUTE PT VISIT: 1 Visit         [facility] PT Acute Rehabilitation Services Office [phone]   Angelina Ok [facility] 09/10/2023, 2:20 PM

## 2023-09-10 NOTE — Plan of Care (Signed)
  Problem: Education: Goal: Ability to demonstrate management of disease process will improve Outcome: Progressing Goal: Ability to verbalize understanding of medication therapies will improve Outcome: Progressing Goal: Individualized Educational Video(s) Outcome: Progressing   Problem: Activity: Goal: Capacity to carry out activities will improve Outcome: Progressing   Problem: Cardiac: Goal: Ability to achieve and maintain adequate cardiopulmonary perfusion will improve Outcome: Progressing   Problem: Education: Goal: Ability to describe self-care measures that may prevent or decrease complications (Diabetes Survival Skills Education) will improve Outcome: Progressing Goal: Individualized Educational Video(s) Outcome: Progressing   Problem: Coping: Goal: Ability to adjust to condition or change in health will improve Outcome: Progressing   Problem: Fluid Volume: Goal: Ability to maintain a balanced intake and output will improve Outcome: Progressing   Problem: Health Behavior/Discharge Planning: Goal: Ability to identify and utilize available resources and services will improve Outcome: Progressing Goal: Ability to manage health-related needs will improve Outcome: Progressing   Problem: Metabolic: Goal: Ability to maintain appropriate glucose levels will improve Outcome: Progressing   Problem: Nutritional: Goal: Maintenance of adequate nutrition will improve Outcome: Progressing Goal: Progress toward achieving an optimal weight will improve Outcome: Progressing   Problem: Skin Integrity: Goal: Risk for impaired skin integrity will decrease Outcome: Progressing   Problem: Tissue Perfusion: Goal: Adequacy of tissue perfusion will improve Outcome: Progressing   Problem: Education: Goal: Knowledge of General Education information will improve Description: Including pain rating scale, medication(s)/side effects and non-pharmacologic comfort measures Outcome:  Progressing   Problem: Health Behavior/Discharge Planning: Goal: Ability to manage health-related needs will improve Outcome: Progressing   Problem: Clinical Measurements: Goal: Ability to maintain clinical measurements within normal limits will improve Outcome: Progressing Goal: Will remain free from infection Outcome: Progressing Goal: Diagnostic test results will improve Outcome: Progressing Goal: Respiratory complications will improve Outcome: Progressing Goal: Cardiovascular complication will be avoided Outcome: Progressing   Problem: Activity: Goal: Risk for activity intolerance will decrease Outcome: Progressing   Problem: Nutrition: Goal: Adequate nutrition will be maintained Outcome: Progressing   Problem: Coping: Goal: Level of anxiety will decrease Outcome: Progressing   Problem: Elimination: Goal: Will not experience complications related to bowel motility Outcome: Progressing Goal: Will not experience complications related to urinary retention Outcome: Progressing   Problem: Pain Management: Goal: General experience of comfort will improve Outcome: Progressing   Problem: Safety: Goal: Ability to remain free from injury will improve Outcome: Progressing   Problem: Skin Integrity: Goal: Risk for impaired skin integrity will decrease Outcome: Progressing   Problem: Education: Goal: Understanding of CV disease, CV risk reduction, and recovery process will improve Outcome: Progressing Goal: Individualized Educational Video(s) Outcome: Progressing   Problem: Activity: Goal: Ability to return to baseline activity level will improve Outcome: Progressing   Problem: Cardiovascular: Goal: Ability to achieve and maintain adequate cardiovascular perfusion will improve Outcome: Progressing Goal: Vascular access site(s) Level 0-1 will be maintained Outcome: Progressing   Problem: Health Behavior/Discharge Planning: Goal: Ability to safely manage  health-related needs after discharge will improve Outcome: Progressing

## 2023-09-10 NOTE — Progress Notes (Signed)
PROGRESS NOTE    Lucas Davis  ZOX:[id number] DOB: 1941-09-03 DOA: 09/07/2023 PCP: [facility]  82/M with history of diastolic CHF, diabetes, RA, osteoarthritis, tremors, fatty liver disease presented to the ED with progressive dyspnea on exertion for 2 to 3 weeks with orthopnea and edema. -Now diagnosed with new onset systolic CHF -Cards following improving on diuretics, right and left heart cath with moderate nonobstructive CAD, elevated filling pressures   Subjective: -Feels better, breathing is improving  Assessment and Plan:  Acute on chronic combined systolic and diastolic CHF, new diagnosis -Previously EF was preserved -Echo now -EF 20-25%, global hypokinesis, moderately reduced RV, small pericardial effusion -Diuresing on IV Lasix, will continue IV Lasix today -Add Jardiance and low-dose Aldactone -Cards following -Right and left heart cath with moderate nonobstructive CAD, elevated filling pressures  Uncontrolled hypertension -Improved, blood pressure now much lower, discontinue BiDil, add GDMT as above  Elevated troponin -Demand ischemia secondary to above  Moderate nonobstructive CAD -Cath as noted above, add aspirin, statin  History of rheumatoid arthritis -Not on DMARDS, holding Celebrex, Tylenol as needed  Essential tremors -Propranolol discontinued 3 months ago after hospitalization for symptomatic bradycardia  Type 2 diabetes mellitus A1c 6.5, CBGs are stable  DVT prophylaxis: Lovenox Code Status: Full code Family Communication: None present Disposition Plan: Home likely 1 to 2 days  Consultants:    Procedures:   Antimicrobials:    Objective: Vitals:   09/09/23 2102 09/10/23 0015 09/10/23 0509 09/10/23 0758  BP: 119/84 106/67 132/61 126/71  Pulse: 60 (!) 54 (!) 58 61  Resp: (!) 21 18 18 20   Temp:  98.3 F (36.8 C) 98.3 F (36.8 C) 98.3 F (36.8 C)  TempSrc:  Oral Oral Oral  SpO2:  95% 99% 92%  Weight:   75.2 kg    Height:        Intake/Output Summary (Last 24 hours) at 09/10/2023 1011 Last data filed at 09/10/2023 0021 Gross per 24 hour  Intake 240 ml  Output 2100 ml  Net -1860 ml   Filed Weights   09/07/23 1419 09/10/23 0509  Weight: 86.2 kg 75.2 kg    Examination:  General exam: Appears calm and comfortable  HEENT: Positive JVD Respiratory system: Breath sounds at the bases Cardiovascular system: S1 & S2 heard, RRR.  Abd: nondistended, soft and nontender.Normal bowel sounds heard. Central nervous system: Alert and oriented. No focal neurological deficits. Extremities: no edema Skin: No rashes Psychiatry:  Mood & affect appropriate.     Data Reviewed:   CBC: Recent Labs  Lab 09/07/23 1328 09/07/23 1629 09/08/23 0515 09/09/23 0455 09/09/23 1335 09/10/23 0321  WBC 7.5  --  7.7 6.9  --  8.1  NEUTROABS 4.5  --   --  3.6  --  4.7  HGB 13.7 14.3 13.5 13.7 12.6*  12.6* 12.3*  HCT 42.9 42.0 41.1 42.8 37.0*  37.0* 37.8*  MCV 89.6  --  89.2 90.1  --  88.7  PLT 271  --  225 270  --  231   Basic Metabolic Panel: Recent Labs  Lab 09/07/23 1328 09/07/23 1629 09/08/23 0638 09/08/23 0730 09/09/23 0455 09/09/23 1335 09/10/23 0321  NA 136 137 139  --  138 142  142 141  K 4.0 4.2 4.1  --  3.9 2.8*  2.8* 4.0  CL 100  --  101  --  101  --  102  CO2 24  --  21*  --  24  --  27  GLUCOSE 115*  --  103*  --  102*  --  81  BUN 27*  --  24*  --  31*  --  26*  CREATININE 1.10  --  1.22  --  1.29*  --  1.26*  CALCIUM 9.3  --  9.2  --  9.2  --  9.0  MG 2.0  --   --  2.0 2.1  --  1.9   GFR: Estimated Creatinine Clearance: 42.3 mL/min (A) (by C-G formula based on SCr of 1.26 mg/dL (H)). Liver Function Tests: Recent Labs  Lab 09/07/23 1328 09/08/23 0638  AST 32 40  ALT 29 29  ALKPHOS 61 60  BILITOT 1.5* 1.9*  PROT 7.0 6.8  ALBUMIN 3.6 3.7   No results for input(s): "LIPASE", "AMYLASE" in the last 168 hours. No results for input(s): "AMMONIA" in the last 168  hours. Coagulation Profile: No results for input(s): "INR", "PROTIME" in the last 168 hours. Cardiac Enzymes: No results for input(s): "CKTOTAL", "CKMB", "CKMBINDEX", "TROPONINI" in the last 168 hours. BNP (last 3 results) No results for input(s): "PROBNP" in the last 8760 hours. HbA1C: No results for input(s): "HGBA1C" in the last 72 hours. CBG: Recent Labs  Lab 09/09/23 0733 09/09/23 1138 09/09/23 1713 09/09/23 2058 09/10/23 0741  GLUCAP 82 91 112* 87 75   Lipid Profile: No results for input(s): "CHOL", "HDL", "LDLCALC", "TRIG", "CHOLHDL", "LDLDIRECT" in the last 72 hours. Thyroid Function Tests: Recent Labs    09/08/23 1058  TSH 2.030   Anemia Panel: No results for input(s): "VITAMINB12", "FOLATE", "FERRITIN", "TIBC", "IRON", "RETICCTPCT" in the last 72 hours. Urine analysis: No results found for: "COLORURINE", "APPEARANCEUR", "LABSPEC", "PHURINE", "GLUCOSEU", "HGBUR", "BILIRUBINUR", "KETONESUR", "PROTEINUR", "UROBILINOGEN", "NITRITE", "LEUKOCYTESUR" Sepsis Labs: @LABRCNTIP (procalcitonin:4,lacticidven:4)  ) Recent Results (from the past 240 hour(s))  Resp panel by RT-PCR (RSV, Flu A&B, Covid) Anterior Nasal Swab     Status: None   Collection Time: 09/07/23  2:06 PM   Specimen: Anterior Nasal Swab  Result Value Ref Range Status   SARS Coronavirus 2 by RT PCR NEGATIVE NEGATIVE Final   Influenza A by PCR NEGATIVE NEGATIVE Final   Influenza B by PCR NEGATIVE NEGATIVE Final    Comment: (NOTE) The Xpert Xpress SARS-CoV-2/FLU/RSV plus assay is intended as an aid in the diagnosis of influenza from Nasopharyngeal swab specimens and should not be used as a sole basis for treatment. Nasal washings and aspirates are unacceptable for Xpert Xpress SARS-CoV-2/FLU/RSV testing.  Fact Sheet for Patients: BloggerCourse.com  Fact Sheet for Healthcare Providers: SeriousBroker.it  This test is not yet approved or cleared by the  [place] FDA and has been authorized for detection and/or diagnosis of SARS-CoV-2 by FDA under an Emergency Use Authorization (EUA). This EUA will remain in effect (meaning this test can be used) for the duration of the COVID-19 declaration under Section 564(b)(1) of the Act, 21 U.S.C. section 360bbb-3(b)(1), unless the authorization is terminated or revoked.     Resp Syncytial Virus by PCR NEGATIVE NEGATIVE Final    Comment: (NOTE) Fact Sheet for Patients: BloggerCourse.com  Fact Sheet for Healthcare Providers: SeriousBroker.it  This test is not yet approved or cleared by the [place] FDA and has been authorized for detection and/or diagnosis of SARS-CoV-2 by FDA under an Emergency Use Authorization (EUA). This EUA will remain in effect (meaning this test can be used) for the duration of the COVID-19 declaration under Section 564(b)(1) of the Act, 21 U.S.C. section 360bbb-3(b)(1), unless the authorization is terminated or  revoked.  Performed at [facility], [address], [place], [place] [address]      Radiology Studies: CARDIAC CATHETERIZATION  Result Date: 09/09/2023   Prox RCA lesion is 60% stenosed.   Mid RCA lesion is 50% stenosed.   Mid RCA to Dist RCA lesion is 40% stenosed.   RPDA lesion is 50% stenosed.   Ost Cx to Mid Cx lesion is 20% stenosed.   2nd Mrg lesion is 30% stenosed.   Lat 2nd Mrg lesion is 30% stenosed.   Ost LAD to Mid LAD lesion is 50% stenosed.   Mid LAD to Dist LAD lesion is 30% stenosed. Moderate non-obstructive throughout the proximal LAD. Mild non-obstructive disease in the Circumflex and large second obtuse marginal branch. Large dominant RCA with heavy calcification throughout the proximal, mid and distal segments. Moderate non-flow limiting stenosis in the proximal vessel. Mild to moderate non-obstructive disease in the mid and distal vessel. Elevated right and left heart pressures  (RA 6, RV 54/3/10, PA 62/13 mean 33, PCWP 26, LV 145/10/27, Ao 144/84)    Cardiac output 4.7 L/min    Cardiac index 2.38 Recommendations: Medical management of non-obstructive CAD and non-ischemic cardiomyopathy. I think he would benefit from further diuresis with IV Lasix given his elevated pressures.     Scheduled Meds:  aspirin  81 mg Oral Daily   empagliflozin  10 mg Oral Daily   enoxaparin (LOVENOX) injection  40 mg Subcutaneous Q24H   furosemide  20 mg Intravenous BID   insulin aspart  0-9 Units Subcutaneous TID WC   pantoprazole  40 mg Oral Daily   polyethylene glycol  17 g Oral Daily   polyvinyl alcohol  1 drop Both Eyes BID   pravastatin  40 mg Oral QPM   sodium chloride flush  3 mL Intravenous Q12H   sodium chloride flush  3 mL Intravenous Q12H   spironolactone  12.5 mg Oral Daily   Continuous Infusions:  sodium chloride       LOS: 2 days    Time spent:    Zannie Cove, MD [facility]   09/10/2023, 10:11 AM

## 2023-09-11 DIAGNOSIS — J9601 Acute respiratory failure with hypoxia: Secondary | ICD-10-CM | POA: Diagnosis not present

## 2023-09-11 DIAGNOSIS — I5043 Acute on chronic combined systolic (congestive) and diastolic (congestive) heart failure: Secondary | ICD-10-CM

## 2023-09-11 DIAGNOSIS — I2510 Atherosclerotic heart disease of native coronary artery without angina pectoris: Secondary | ICD-10-CM | POA: Diagnosis not present

## 2023-09-11 LAB — CBC WITH DIFFERENTIAL/PLATELET
Abs Immature Granulocytes: 0.02 10*3/uL (ref 0.00–0.07)
Basophils Absolute: 0 10*3/uL (ref 0.0–0.1)
Basophils Relative: 0 %
Eosinophils Absolute: 0.1 10*3/uL (ref 0.0–0.5)
Eosinophils Relative: 1 %
HCT: 40.5 % (ref 39.0–52.0)
Hemoglobin: 13.1 g/dL (ref 13.0–17.0)
Immature Granulocytes: 0 %
Lymphocytes Relative: 32 %
Lymphs Abs: 2.4 10*3/uL (ref 0.7–4.0)
MCH: 28.6 pg (ref 26.0–34.0)
MCHC: 32.3 g/dL (ref 30.0–36.0)
MCV: 88.4 fL (ref 80.0–100.0)
Monocytes Absolute: 1.1 10*3/uL — ABNORMAL HIGH (ref 0.1–1.0)
Monocytes Relative: 14 %
Neutro Abs: 4.1 10*3/uL (ref 1.7–7.7)
Neutrophils Relative %: 53 %
Platelets: 245 10*3/uL (ref 150–400)
RBC: 4.58 MIL/uL (ref 4.22–5.81)
RDW: 14.9 % (ref 11.5–15.5)
WBC: 7.6 10*3/uL (ref 4.0–10.5)
nRBC: 0 % (ref 0.0–0.2)

## 2023-09-11 LAB — MAGNESIUM: Magnesium: 2 mg/dL (ref 1.7–2.4)

## 2023-09-11 LAB — LIPID PANEL
Cholesterol: 131 mg/dL (ref 0–200)
HDL: 35 mg/dL — ABNORMAL LOW (ref >40)
LDL Cholesterol: 84 mg/dL (ref 0–99)
Total CHOL/HDL Ratio: 3.7 {ratio}
Triglycerides: 62 mg/dL (ref <150)
VLDL: 12 mg/dL (ref 0–40)

## 2023-09-11 LAB — BASIC METABOLIC PANEL
Anion gap: 12 (ref 5–15)
BUN: 22 mg/dL (ref 8–23)
CO2: 26 mmol/L (ref 22–32)
Calcium: 8.7 mg/dL — ABNORMAL LOW (ref 8.9–10.3)
Chloride: 102 mmol/L (ref 98–111)
Creatinine, Ser: 1 mg/dL (ref 0.61–1.24)
GFR, Estimated: >60 mL/min (ref >60)
Glucose, Bld: 92 mg/dL (ref 70–99)
Potassium: 3.1 mmol/L — ABNORMAL LOW (ref 3.5–5.1)
Sodium: 140 mmol/L (ref 135–145)

## 2023-09-11 LAB — GLUCOSE, CAPILLARY: Glucose-Capillary: 108 mg/dL — ABNORMAL HIGH (ref 70–99)

## 2023-09-11 MED ORDER — SPIRONOLACTONE 25 MG PO TABS
25.0000 mg | ORAL_TABLET | Freq: Every day | ORAL | Status: DC
  Administered 2023-09-11: 25 mg via ORAL
  Filled 2023-09-11: qty 1

## 2023-09-11 MED ORDER — ASPIRIN 81 MG PO CHEW: 81.0000 mg | CHEWABLE_TABLET | Freq: Every day | ORAL | 0 refills | Status: DC

## 2023-09-11 MED ORDER — FUROSEMIDE 40 MG PO TABS: 40.0000 mg | ORAL_TABLET | Freq: Every day | ORAL | Status: DC

## 2023-09-11 MED ORDER — ACETAMINOPHEN 325 MG PO TABS: 650.0000 mg | ORAL_TABLET | Freq: Four times a day (QID) | ORAL | Status: DC | PRN

## 2023-09-11 MED ORDER — LOSARTAN POTASSIUM 25 MG PO TABS: 25.0000 mg | ORAL_TABLET | Freq: Every day | ORAL | 0 refills | Status: DC

## 2023-09-11 MED ORDER — POTASSIUM CHLORIDE CRYS ER 20 MEQ PO TBCR: 40.0000 meq | EXTENDED_RELEASE_TABLET | Freq: Two times a day (BID) | ORAL | Status: DC

## 2023-09-11 MED ORDER — POTASSIUM CHLORIDE CRYS ER 20 MEQ PO TBCR
40.0000 meq | EXTENDED_RELEASE_TABLET | Freq: Two times a day (BID) | ORAL | Status: DC
  Administered 2023-09-11: 40 meq via ORAL
  Filled 2023-09-11: qty 2

## 2023-09-11 MED ORDER — SPIRONOLACTONE 25 MG PO TABS: 25.0000 mg | ORAL_TABLET | Freq: Every day | ORAL | 0 refills | Status: AC

## 2023-09-11 MED ORDER — FUROSEMIDE 40 MG PO TABS: 40.0000 mg | ORAL_TABLET | Freq: Every day | ORAL | 0 refills | Status: DC

## 2023-09-11 MED ORDER — EMPAGLIFLOZIN 10 MG PO TABS: 10.0000 mg | ORAL_TABLET | Freq: Every day | ORAL | 0 refills | Status: DC

## 2023-09-11 MED ORDER — ROSUVASTATIN CALCIUM 40 MG PO TABS: 40.0000 mg | ORAL_TABLET | Freq: Every day | ORAL | 0 refills | Status: DC

## 2023-09-11 NOTE — Discharge Summary (Signed)
Physician Discharge Summary  Lucas Davis IEP:[id number] DOB: May 27, 1941 DOA: 09/07/2023  PCP: [facility]  Admit date: 09/07/2023 Discharge date: 09/11/2023  Time spent:45 minutes  Recommendations for Outpatient Follow-up:  CHF TOC clinic on 11/22 Please check BMP in 1 week   Discharge Diagnoses:  Principal Problem:   Acute combined systolic and diastolic heart failure (HCC) Active Problems:   Sensorineural hearing loss (SNHL), bilateral   Essential hypertension   Dyslipidemia   Bradycardia   Type 2 diabetes mellitus (HCC)   Acute respiratory failure with hypoxia (HCC)   Acute on chronic diastolic CHF (congestive heart failure) (HCC)   Demand ischemia (HCC)   Acute on chronic congestive heart failure (HCC)   Coronary artery disease involving native coronary artery of native heart without angina pectoris   Discharge Condition: Improved  Diet recommendation: Low sodium, heart healthy  Filed Weights   09/07/23 1419 09/10/23 0509 09/11/23 0339  Weight: 86.2 kg 75.2 kg 74 kg    History of present illness:  82/M with history of diastolic CHF, diabetes, RA, osteoarthritis, tremors, fatty liver disease presented to the ED with progressive dyspnea on exertion for 2 to 3 weeks with orthopnea and edema. -Now diagnosed with new onset systolic CHF -Cards following improving on diuretics, right and left heart cath with moderate nonobstructive CAD, elevated filling pressures  Hospital Course:   Acute on chronic combined systolic and diastolic CHF, new diagnosis -Previously EF was preserved -Echo now -EF 20-25%, global hypokinesis, moderately reduced RV, small pericardial effusion -Right and left heart cath noted moderate nonobstructive CAD with elevated filling pressures -Improved with diuresis, weaned off oxygen -Transitioned to oral Lasix, Jardiance, Aldactone and low-dose losartan -Followed by cardiology this admission, discharged home in stable condition,  follow-up with CHF TOC clinic arranged, needs BMP in 1 week   Uncontrolled hypertension -Improved, blood pressure now much lower, discontinue BiDil, started GDMT as above   Elevated troponin -Demand ischemia secondary to above   Moderate nonobstructive CAD -Cath as noted above, added aspirin, statin   History of rheumatoid arthritis -Not on DMARDS, stopped Celebrex, Tylenol as needed   Essential tremors -Propranolol discontinued 3 months ago after hospitalization for symptomatic bradycardia   Type 2 diabetes mellitus A1c 6.5, CBGs are stable, started on Jardiance   Consultations: Cards  Discharge Exam: Vitals:   09/11/23 0339 09/11/23 0851  BP: (!) 153/85 139/71  Pulse:    Resp: 20 (!) 32  Temp: 98.9 F (37.2 C) 98.3 F (36.8 C)  SpO2:  91%   Gen: Awake, Alert, Oriented X 3,  HEENT: no JVD Lungs: Good air movement bilaterally, CTAB CVS: S1S2/RRR Abd: soft, Non tender, non distended, BS present Extremities: No edema Skin: no new rashes on exposed skin   Discharge Instructions   Discharge Instructions     Diet - low sodium heart healthy   Complete by: As directed    Increase activity slowly   Complete by: As directed       Allergies as of 09/11/2023       Reactions   Beta Adrenergic Blockers    Symptomatic bradycardia but may have overdose, ?   Oxycontin [oxycodone] Other (See Comments)   Hallucinations  Severe drowsiness        Medication List     STOP taking these medications    celecoxib 200 MG capsule Commonly known as: CELEBREX       TAKE these medications    acetaminophen 325 MG tablet Commonly known as: TYLENOL Take  2 tablets (650 mg total) by mouth every 6 (six) hours as needed for mild pain (pain score 1-3) (or Fever >/= 101).   amitriptyline 50 MG tablet Commonly known as: ELAVIL Take 50 mg by mouth at bedtime.   aspirin 81 MG chewable tablet Chew 1 tablet (81 mg total) by mouth daily. Start taking on: September 12, 2023   bisacodyl 5 MG EC tablet Commonly known as: DULCOLAX Take 5 mg by mouth daily as needed for severe constipation.   carboxymethylcellulose 0.5 % Soln Commonly known as: REFRESH PLUS Place 1 drop into both eyes 2 (two) times daily.   COQ10 PO Take 1 capsule by mouth daily.   cyclobenzaprine 10 MG tablet Commonly known as: FLEXERIL Take 5 mg by mouth every 8 (eight) hours as needed for muscle spasms.   empagliflozin 10 MG Tabs tablet Commonly known as: JARDIANCE Take 1 tablet (10 mg total) by mouth daily. Start taking on: September 12, 2023   Fish Oil 1000 MG Caps Take 1,000 mg by mouth 2 (two) times daily.   furosemide 40 MG tablet Commonly known as: LASIX Take 1 tablet (40 mg total) by mouth daily. Start taking on: September 12, 2023   losartan 25 MG tablet Commonly known as: Cozaar Take 1 tablet (25 mg total) by mouth daily.   multivitamin tablet Take 1 tablet by mouth daily.   omeprazole 20 MG tablet Commonly known as: PRILOSEC OTC Take 20 mg by mouth daily.   pravastatin 40 MG tablet Commonly known as: PRAVACHOL Take 40 mg by mouth every evening.   rosuvastatin 40 MG tablet Commonly known as: CRESTOR Take 1 tablet (40 mg total) by mouth at bedtime.   spironolactone 25 MG tablet Commonly known as: ALDACTONE Take 1 tablet (25 mg total) by mouth daily. Start taking on: September 12, 2023       Allergies  Allergen Reactions   Beta Adrenergic Blockers     Symptomatic bradycardia but may have overdose, ?   Oxycontin [Oxycodone] Other (See Comments)    Hallucinations  Severe drowsiness    Follow-up Information     [facility]. Go in 12 day(s).   Specialty: Cardiology Why: Hospital follow up 09/23/2023 @ 12 noon PLEASE bring a current medication list to appointment FREE valet parking, Entrance C, off [employer] information: [address] [place]  [address] [phone]                 The results of significant diagnostics from this hospitalization (including imaging, microbiology, ancillary and laboratory) are listed below for reference.    Significant Diagnostic Studies: CARDIAC CATHETERIZATION  Result Date: 09/09/2023   Prox RCA lesion is 60% stenosed.   Mid RCA lesion is 50% stenosed.   Mid RCA to Dist RCA lesion is 40% stenosed.   RPDA lesion is 50% stenosed.   Ost Cx to Mid Cx lesion is 20% stenosed.   2nd Mrg lesion is 30% stenosed.   Lat 2nd Mrg lesion is 30% stenosed.   Ost LAD to Mid LAD lesion is 50% stenosed.   Mid LAD to Dist LAD lesion is 30% stenosed. Moderate non-obstructive throughout the proximal LAD. Mild non-obstructive disease in the Circumflex and large second obtuse marginal branch. Large dominant RCA with heavy calcification throughout the proximal, mid and distal segments. Moderate non-flow limiting stenosis in the proximal vessel. Mild to moderate non-obstructive disease in the mid and distal vessel. Elevated right and  left heart pressures (RA 6, RV 54/3/10, PA 62/13 mean 33, PCWP 26, LV 145/10/27, Ao 144/84)    Cardiac output 4.7 L/min    Cardiac index 2.38 Recommendations: Medical management of non-obstructive CAD and non-ischemic cardiomyopathy. I think he would benefit from further diuresis with IV Lasix given his elevated pressures.   ECHOCARDIOGRAM LIMITED  Result Date: 09/08/2023    ECHOCARDIOGRAM REPORT   Patient Name:   GARELD BLAES Date of Exam: 09/08/2023 Medical Rec #:  [id number]       Height:       67.0 in Accession #:    [id number]      Weight:       190.0 lb Date of Birth:  October 21, 1941       BSA:          1.979 m Patient Age:    82 years        BP:           169/106 mmHg Patient Gender: M               HR:           89 bpm. Exam Location:  Inpatient Procedure: Limited Echo, Cardiac Doppler, Color Doppler and Intracardiac            Opacification Agent Indications:     Dyspnea  History:          Patient has prior history of Echocardiogram examinations, most                  recent 06/28/2023. Risk Factors:Hypertension, Diabetes and                  Dyslipidemia.  Sonographer:     Harriette Bouillon RDCS Referring Phys:  [id number] Cecille Po MELVIN Diagnosing Phys: Weston Brass MD IMPRESSIONS  1. Left ventricular ejection fraction, by estimation, is 20 to 25%. The left ventricle has severely decreased function. The left ventricle demonstrates global hypokinesis. The left ventricular internal cavity size was mildly to moderately dilated. Left ventricular diastolic function could not be evaluated.  2. Right ventricular systolic function is moderately reduced. The right ventricular size is normal. Tricuspid regurgitation signal is inadequate for assessing PA pressure.  3. Left atrial size was moderately dilated.  4. A small pericardial effusion is present.  5. The mitral valve is degenerative. Trivial mitral valve regurgitation. Moderate mitral annular calcification.  6. The aortic valve is tricuspid. There is mild calcification of the aortic valve.  7. The inferior vena cava is dilated in size with <50% respiratory variability, suggesting right atrial pressure of 15 mmHg. FINDINGS  Left Ventricle: Left ventricular ejection fraction, by estimation, is 20 to 25%. The left ventricle has severely decreased function. The left ventricle demonstrates global hypokinesis. The left ventricular internal cavity size was mildly to moderately dilated. Suboptimal image quality limits for assessment of left ventricular hypertrophy. Left ventricular diastolic function could not be evaluated. Right Ventricle: The right ventricular size is normal. No increase in right ventricular wall thickness. Right ventricular systolic function is moderately reduced. Tricuspid regurgitation signal is inadequate for assessing PA pressure. Left Atrium: Left atrial size was moderately dilated. Right Atrium: Right atrial size was normal in size.  Pericardium: A small pericardial effusion is present. Mitral Valve: The mitral valve is degenerative in appearance. Moderate mitral annular calcification. Trivial mitral valve regurgitation. Tricuspid Valve: The tricuspid valve is grossly normal. Tricuspid valve regurgitation is trivial. Aortic Valve: The aortic valve is tricuspid. There is  mild calcification of the aortic valve. Pulmonic Valve: The pulmonic valve was grossly normal. Aorta: The aortic root is normal in size and structure. Venous: The inferior vena cava is dilated in size with less than 50% respiratory variability, suggesting right atrial pressure of 15 mmHg. IAS/Shunts: No atrial level shunt detected by color flow Doppler.  LEFT VENTRICLE PLAX 2D LVIDd:         6.40 cm LVIDs:         5.60 cm LV PW:         0.90 cm LV IVS:        0.90 cm  IVC IVC diam: 2.50 cm LEFT ATRIUM         Index LA diam:    5.90 cm 2.98 cm/m Weston Brass MD Electronically signed by Weston Brass MD Signature Date/Time: 09/08/2023/2:26:54 PM    Final (Updated)    DG Chest Portable 1 View  Result Date: 09/07/2023 CLINICAL DATA:  SOB, tachypnea. EXAM: PORTABLE CHEST 1 VIEW COMPARISON:  06/28/2023. FINDINGS: There are left retrocardiac airspace opacities obscuring the left hemidiaphragm and descending thoracic aorta suggesting combination of left lower lobe atelectasis and/or consolidation. Bilateral lung fields are otherwise clear. Bilateral costophrenic angles are clear. Stable mildly enlarged cardio-mediastinal silhouette. Aortic arch calcifications noted. No acute osseous abnormalities. The soft tissues are within normal limits. IMPRESSION: *Stable mild cardiomegaly. *Left retrocardiac opacity, as described above. Electronically Signed   By: Jules Schick M.D.   On: 09/07/2023 16:46    Microbiology: Recent Results (from the past 240 hour(s))  Resp panel by RT-PCR (RSV, Flu A&B, Covid) Anterior Nasal Swab     Status: None   Collection Time: 09/07/23  2:06 PM    Specimen: Anterior Nasal Swab  Result Value Ref Range Status   SARS Coronavirus 2 by RT PCR NEGATIVE NEGATIVE Final   Influenza A by PCR NEGATIVE NEGATIVE Final   Influenza B by PCR NEGATIVE NEGATIVE Final    Comment: (NOTE) The Xpert Xpress SARS-CoV-2/FLU/RSV plus assay is intended as an aid in the diagnosis of influenza from Nasopharyngeal swab specimens and should not be used as a sole basis for treatment. Nasal washings and aspirates are unacceptable for Xpert Xpress SARS-CoV-2/FLU/RSV testing.  Fact Sheet for Patients: BloggerCourse.com  Fact Sheet for Healthcare Providers: SeriousBroker.it  This test is not yet approved or cleared by the [place] FDA and has been authorized for detection and/or diagnosis of SARS-CoV-2 by FDA under an Emergency Use Authorization (EUA). This EUA will remain in effect (meaning this test can be used) for the duration of the COVID-19 declaration under Section 564(b)(1) of the Act, 21 U.S.C. section 360bbb-3(b)(1), unless the authorization is terminated or revoked.     Resp Syncytial Virus by PCR NEGATIVE NEGATIVE Final    Comment: (NOTE) Fact Sheet for Patients: BloggerCourse.com  Fact Sheet for Healthcare Providers: SeriousBroker.it  This test is not yet approved or cleared by the [place] FDA and has been authorized for detection and/or diagnosis of SARS-CoV-2 by FDA under an Emergency Use Authorization (EUA). This EUA will remain in effect (meaning this test can be used) for the duration of the COVID-19 declaration under Section 564(b)(1) of the Act, 21 U.S.C. section 360bbb-3(b)(1), unless the authorization is terminated or revoked.  Performed at [facility], [address], [place], [place] [address]      Labs: Basic Metabolic Panel: Recent Labs  Lab 09/07/23 1328 09/07/23 1629 09/08/23 6045 09/08/23 0730  09/09/23 0455 09/09/23 1335 09/10/23 0321  09/11/23 0409  NA 136   < > 139  --  138 142  142 141 140  K 4.0   < > 4.1  --  3.9 2.8*  2.8* 4.0 3.1*  CL 100  --  101  --  101  --  102 102  CO2 24  --  21*  --  24  --  27 26  GLUCOSE 115*  --  103*  --  102*  --  81 92  BUN 27*  --  24*  --  31*  --  26* 22  CREATININE 1.10  --  1.22  --  1.29*  --  1.26* 1.00  CALCIUM 9.3  --  9.2  --  9.2  --  9.0 8.7*  MG 2.0  --   --  2.0 2.1  --  1.9 2.0   < > = values in this interval not displayed.   Liver Function Tests: Recent Labs  Lab 09/07/23 1328 09/08/23 0638  AST 32 40  ALT 29 29  ALKPHOS 61 60  BILITOT 1.5* 1.9*  PROT 7.0 6.8  ALBUMIN 3.6 3.7   No results for input(s): "LIPASE", "AMYLASE" in the last 168 hours. No results for input(s): "AMMONIA" in the last 168 hours. CBC: Recent Labs  Lab 09/07/23 1328 09/07/23 1629 09/08/23 0515 09/09/23 0455 09/09/23 1335 09/10/23 0321 09/11/23 0409  WBC 7.5  --  7.7 6.9  --  8.1 7.6  NEUTROABS 4.5  --   --  3.6  --  4.7 4.1  HGB 13.7   < > 13.5 13.7 12.6*  12.6* 12.3* 13.1  HCT 42.9   < > 41.1 42.8 37.0*  37.0* 37.8* 40.5  MCV 89.6  --  89.2 90.1  --  88.7 88.4  PLT 271  --  225 270  --  231 245   < > = values in this interval not displayed.   Cardiac Enzymes: No results for input(s): "CKTOTAL", "CKMB", "CKMBINDEX", "TROPONINI" in the last 168 hours. BNP: BNP (last 3 results) Recent Labs    09/09/23 0445 09/09/23 0734 09/10/23 0321  BNP 2,582.6* 2,453.7* 1,471.7*    ProBNP (last 3 results) No results for input(s): "PROBNP" in the last 8760 hours.  CBG: Recent Labs  Lab 09/10/23 0741 09/10/23 1200 09/10/23 1735 09/10/23 2112 09/11/23 0858  GLUCAP 75 121* 91 110* 108*       Signed:  Zannie Cove MD.  [facility] 09/11/2023, 10:28 AM

## 2023-09-11 NOTE — Progress Notes (Signed)
Patient Name: Lucas Davis Date of Encounter: 09/11/2023 [facility] Cardiologist: Rollene Rotunda, MD   Interval Summary  .    Net negative 625 cc, overall 2.5L negative. Potassium low at 3.1 today. Creatinine normal at 1.0. LDL 84  Vital Signs .    Vitals:   09/10/23 1700 09/10/23 1936 09/11/23 0339 09/11/23 0851  BP: 130/88 128/65 (!) 153/85 139/71  Pulse: 79 79    Resp:  20 20 (!) 32  Temp: 98.3 F (36.8 C) 98.4 F (36.9 C) 98.9 F (37.2 C) 98.3 F (36.8 C)  TempSrc: Oral Oral Oral Oral  SpO2:  97%  91%  Weight:   74 kg   Height:        Intake/Output Summary (Last 24 hours) at 09/11/2023 1009 Last data filed at 09/10/2023 1938 Gross per 24 hour  Intake --  Output 625 ml  Net -625 ml      09/11/2023    3:39 AM 09/10/2023    5:09 AM 09/07/2023    2:19 PM  Last 3 Weights  Weight (lbs) 163 lb 1.6 oz 165 lb 12.8 oz 190 lb  Weight (kg) 73.982 kg 75.206 kg 86.183 kg      Telemetry/ECG    Sinus rhythm with PVC's - Personally Reviewed  Physical Exam .    General appearance: alert and no distress Neck: JVD - 5 cm above sternal notch, no carotid bruit, and thyroid not enlarged, symmetric, no tenderness/mass/nodules Lungs: clear to auscultation bilaterally Heart: regular rate and rhythm Abdomen: soft, non-tender; bowel sounds normal; no masses,  no organomegaly Extremities: edema trace Pulses: 2+ and symmetric Skin: Skin color, texture, turgor normal. No rashes or lesions Neurologic: Grossly normal Psych: Pleasant  Assessment & Plan .    BENJIMEN TREDWAY is a 82 y.o. male with a hx of hypertension, hyperlipidemia, symptomatic bradycardia, fatty liver disease, RA/OA, GERD, essential tremor who is being seen for the evaluation of CHF exacerbation.    Acute hypoxic respiratory failure Acute on chronic congestive heart failure with newly reduced LVEF  Patient admitted with acute hypoxic respiratory failure requiring brief support with BiPAP. Has had  worsening dyspnea and leg edema over the past several weeks. TTE on 11/7 shows LVEF 20-25% with global hypokinesis, down from 55-60% as of August 2024.   Near euvolemic - will change to oral lasix 40 mg daily tomorrow. Continue GDMT optimization - add losartan 25 mg daily today.  Hx of symptomatic bradycardia EKG with ?retrograde p waves vs 2:1 heart block  Patient admitted in August 2024 with symptomatic bradycardia, felt to be secondary to propranolol. He was off all AV nodal agents following this admission though did receive IV lopressor this current admission.    Stable sinus rhythm, occasional PVC's Continue to avoid AV nodal agents  Elevated troponin   HS troponin this admission 120->91.  Cath showed moderate non-obstructive CAD  Hyperlipidemia  Change pravastatin to HIS - rosuvastatin 20 mg at bedtime.  LDL 86, LP(a) pending  Probably ok to discharge this afternoon from cardiology standpoint. Has TOC follow-up on 11/22 and we will arrange for follow-up with Dr. Antoine Poche or APP.  For questions or updates, please contact [facility] Please consult [email or web address] for contact info under   Chrystie Nose, MD, Milagros Loll  [facility]  Medical Director of the [facility] &  Cardiovascular Risk Reduction Clinic Diplomate of the American Board of Clinical Lipidology Attending Cardiologist  Direct Dial: [phone]  Fax: [phone]  Website:  [email or web address]  Chrystie Nose, MD

## 2023-09-12 ENCOUNTER — Encounter (HOSPITAL_COMMUNITY): Payer: Self-pay | Admitting: Cardiovascular Disease

## 2023-09-12 LAB — LIPOPROTEIN A (LPA): Lipoprotein (a): 67.4 nmol/L — ABNORMAL HIGH (ref <75.0)

## 2023-09-13 ENCOUNTER — Telehealth: Payer: Self-pay

## 2023-09-13 NOTE — Patient Instructions (Signed)
Visit Information  Thank you for taking time to visit with me today. Please don't hesitate to contact me if I can be of assistance to you before our next scheduled telephone appointment.  Outpatient Encounter Medications as of 09/13/2023  Medication Sig   acetaminophen (TYLENOL) 325 MG tablet Take 2 tablets (650 mg total) by mouth every 6 (six) hours as needed for mild pain (pain score 1-3) (or Fever >/= 101).   amitriptyline (ELAVIL) 50 MG tablet Take 50 mg by mouth at bedtime.   aspirin 81 MG chewable tablet Chew 1 tablet (81 mg total) by mouth daily.   bisacodyl (DULCOLAX) 5 MG EC tablet Take 5 mg by mouth daily as needed for severe constipation.   carboxymethylcellulose (REFRESH PLUS) 0.5 % SOLN Place 1 drop into both eyes 2 (two) times daily.   Coenzyme Q10 (COQ10 PO) Take 1 capsule by mouth daily.   empagliflozin (JARDIANCE) 10 MG TABS tablet Take 1 tablet (10 mg total) by mouth daily.   furosemide (LASIX) 40 MG tablet Take 1 tablet (40 mg total) by mouth daily.   losartan (COZAAR) 25 MG tablet Take 1 tablet (25 mg total) by mouth daily.   Multiple Vitamin (MULTIVITAMIN) tablet Take 1 tablet by mouth daily.   Omega-3 Fatty Acids (FISH OIL) 1000 MG CAPS Take 1,000 mg by mouth 2 (two) times daily.   omeprazole (PRILOSEC OTC) 20 MG tablet Take 20 mg by mouth daily.   pravastatin (PRAVACHOL) 40 MG tablet Take 40 mg by mouth every evening.   rosuvastatin (CRESTOR) 40 MG tablet Take 1 tablet (40 mg total) by mouth at bedtime.   spironolactone (ALDACTONE) 25 MG tablet Take 1 tablet (25 mg total) by mouth daily.   cyclobenzaprine (FLEXERIL) 10 MG tablet Take 5 mg by mouth every 8 (eight) hours as needed for muscle spasms. (Patient not taking: Reported on 09/13/2023)   No facility-administered encounter medications on file as of 09/13/2023.     If you are experiencing a Mental Health or Behavioral Health Crisis or need someone to talk to, please call the Suicide and Crisis Lifeline: 988 call  the [place] National Suicide Prevention Lifeline: 925 723 4823 or TTY: 812-828-2406 TTY [phone]) to talk to a trained counselor call 1-800-273-TALK (toll free, 24 hour hotline) go to [facility] [address], [place] [phone]) call 911   The patient verbalized understanding of instructions, educational materials, and care plan provided today and agreed to receive a mailed copy of patient instructions, educational materials, and care plan.   Lonia Chimera, RN, BSN, CEN [employer]- Transition of Care Team.  [facility] [phone]

## 2023-09-13 NOTE — Transitions of Care (Post Inpatient/ED Visit) (Signed)
09/13/2023  Name: Lucas Davis MRN: [id number] DOB: 02/16/1941  Today's TOC FU Call Status: Today's TOC FU Call Status:: Successful TOC FU Call Completed TOC FU Call Complete Date: 09/13/23 Patient's Name and Date of Birth confirmed.  Transition Care Management Follow-up Telephone Call Date of Discharge: 09/11/23 Discharge Facility: [facility]) Type of Discharge: Inpatient Admission Primary Inpatient Discharge Diagnosis:: New onset CHF How have you been since you were released from the hospital?: Better (Reports that he is feeling much better. no chest pain or shortness of breath) Any questions or concerns?: Yes Patient Questions/Concerns:: reports that he need a medicine list. Reports that he does not know who his primary MD. States that her goes to [facility] [place]. Patient Questions/Concerns Addressed: Provided Patient Educational Materials  Items Reviewed: Did you receive and understand the discharge instructions provided?: Yes Medications obtained,verified, and reconciled?: Yes (Medications Reviewed) Any new allergies since your discharge?: No Dietary orders reviewed?: Yes Type of Diet Ordered:: low salt Do you have support at home?: Yes People in Home: spouse Name of Support/Comfort Primary Source: wife  Outpatient Encounter Medications as of 09/13/2023  Medication Sig   acetaminophen (TYLENOL) 325 MG tablet Take 2 tablets (650 mg total) by mouth every 6 (six) hours as needed for mild pain (pain score 1-3) (or Fever >/= 101).   amitriptyline (ELAVIL) 50 MG tablet Take 50 mg by mouth at bedtime.   aspirin 81 MG chewable tablet Chew 1 tablet (81 mg total) by mouth daily.   bisacodyl (DULCOLAX) 5 MG EC tablet Take 5 mg by mouth daily as needed for severe constipation.   carboxymethylcellulose (REFRESH PLUS) 0.5 % SOLN Place 1 drop into both eyes 2 (two) times daily.   Coenzyme Q10 (COQ10 PO) Take 1 capsule by mouth daily.   empagliflozin (JARDIANCE) 10 MG TABS tablet  Take 1 tablet (10 mg total) by mouth daily.   furosemide (LASIX) 40 MG tablet Take 1 tablet (40 mg total) by mouth daily.   losartan (COZAAR) 25 MG tablet Take 1 tablet (25 mg total) by mouth daily.   Multiple Vitamin (MULTIVITAMIN) tablet Take 1 tablet by mouth daily.   Omega-3 Fatty Acids (FISH OIL) 1000 MG CAPS Take 1,000 mg by mouth 2 (two) times daily.   omeprazole (PRILOSEC OTC) 20 MG tablet Take 20 mg by mouth daily.   pravastatin (PRAVACHOL) 40 MG tablet Take 40 mg by mouth every evening.   rosuvastatin (CRESTOR) 40 MG tablet Take 1 tablet (40 mg total) by mouth at bedtime.   spironolactone (ALDACTONE) 25 MG tablet Take 1 tablet (25 mg total) by mouth daily.   cyclobenzaprine (FLEXERIL) 10 MG tablet Take 5 mg by mouth every 8 (eight) hours as needed for muscle spasms. (Patient not taking: Reported on 09/13/2023)   No facility-administered encounter medications on file as of 09/13/2023.    Home Care and Equipment/Supplies: Were Home Health Services Ordered?: NA Any new equipment or medical supplies ordered?: NA  Functional Questionnaire: Do you need assistance with bathing/showering or dressing?: No Do you need assistance with meal preparation?: Yes (wife) Do you need assistance with eating?: No Do you have difficulty maintaining continence: No Do you need assistance with getting out of bed/getting out of a chair/moving?: No Do you have difficulty managing or taking your medications?: No  Follow up appointments reviewed: PCP Follow-up appointment confirmed?: No MD Provider Line [phone] Given: No Specialist Hospital Follow-up appointment confirmed?: Yes Date of Specialist follow-up appointment?: 09/19/23 Follow-Up Specialty Provider:: cardiology Do you  need transportation to your follow-up appointment?: No Do you understand care options if your condition(s) worsen?: Yes-patient verbalized understanding  SDOH Interventions Today    Flowsheet Row Most Recent Value   SDOH Interventions   Food Insecurity Interventions Intervention Not Indicated  Housing Interventions Intervention Not Indicated  Transportation Interventions Intervention Not Indicated  Utilities Interventions Intervention Not Indicated     Patient reports that he is doing well. Reports that he needs a new medication list. Reports that he has been getting his medical care at the [place]. Placed call to PCP office and scheduled office visit for hospital discharge.  Reviewed all medications with patient. Reviewed heart failure zones and when to call MD. Reviewed how to weigh. Reviewed importance of low salt diet.  Offered patient TOC 30 day program and patient declined.  Provided my contact information if patient changes his mind. Will mail AVS, Heart failure zones and low salt diet to patient.  Lonia Chimera, RN, BSN, CEN [employer]- Transition of Care Team.  [facility] [phone]

## 2023-09-14 MED FILL — Heparin Sodium (Porcine) Inj 1000 Unit/ML: INTRAMUSCULAR | Qty: 10 | Status: AC

## 2023-09-18 NOTE — Progress Notes (Unsigned)
Cardiology Office Note:  .   Date:  09/19/2023  ID:  KOLBI SHETTY, DOB 04/15/1941, MRN [id number] PCP: Linus Galas, NP  [facility] Providers Cardiologist:James Hochrein, MD  }   History of Present Illness: .   Lucas Davis is a 82 y.o. male with history of hypertension, dyslipidemia, type 2 diabetes, and CAD; we are seeing post hospitalization 7- Day TOC follow up, (admitted from 09/07/2023 through 09/11/2023) follow-up in the setting of acute hypoxic respiratory failure, acute on chronic systolic heart failure (EF of 20 to 25% with global hypokinesis) significant change from 55 to 60% in August 2024, symptomatic bradycardia with 2-1 heart block   He underwent cardiac catheterization on 09/09/2023 revealing moderate nonobstructive CAD throughout the proximal LAD, circumflex and large of twos marginal branch.  There was a large dominant RCA with heavy calcification throughout the proximal mid and distal segments.  Medical management was recommended, and he was diagnosed with nonischemic cardiomyopathy.  He was started on Jardiance 10 mg daily, Lasix 40 mg daily, losartan 25 mg daily, spironolactone 25 mg daily.  Most recent labs during hospitalization creatinine 1.00.  Blood pressure 139/71 on discharge.  He comes today without any cardiac complaints but does have multiple questions concerning the cardiac catheterization, reasons for new medications.  He denies any weight gain, shortness of breath, dizziness, or palpitations.  In fact he is lost 30 pounds over the last several months.  He has been avoiding salt and states that the food does not taste very good anymore.  He is just not hungry.  ROS: As above otherwise negative  Studies Reviewed: Marland Kitchen   EKG Interpretation Date/Time:  Monday September 19 2023 13:47:16 EST Ventricular Rate:  72 PR Interval:  222 QRS Duration:  128 QT Interval:  410 QTC Calculation: 448 R Axis:   -42  Text Interpretation: Sinus rhythm with 1st degree  A-V block with occasional Premature ventricular complexes and Premature atrial complexes Left axis deviation Left bundle branch block When compared with ECG of 07-Sep-2023 12:55, Premature atrial complexes are now Present T wave inversion now evident in Lateral leads Confirmed by Joni Reining [phone]) on 09/19/2023 2:33:53 PM  RHC/LHC 09/12/2023  Prox RCA lesion is 60% stenosed.   Mid RCA lesion is 50% stenosed.   Mid RCA to Dist RCA lesion is 40% stenosed.   RPDA lesion is 50% stenosed.   Ost Cx to Mid Cx lesion is 20% stenosed.   2nd Mrg lesion is 30% stenosed.   Lat 2nd Mrg lesion is 30% stenosed.   Ost LAD to Mid LAD lesion is 50% stenosed.   Mid LAD to Dist LAD lesion is 30% stenosed.   Moderate non-obstructive throughout the proximal LAD.  Mild non-obstructive disease in the Circumflex and large second obtuse marginal branch.  Large dominant RCA with heavy calcification throughout the proximal, mid and distal segments. Moderate non-flow limiting stenosis in the proximal vessel. Mild to moderate non-obstructive disease in the mid and distal vessel.  Elevated right and left heart pressures (RA 6, RV 54/3/10, PA 62/13 mean 33, PCWP 26, LV 145/10/27, Ao 144/84)    Cardiac output 4.7 L/min    Cardiac index 2.38    Recommendations: Medical management of non-obstructive CAD and non-ischemic cardiomyopathy.   Echocardiogram 09/08/2023 1. Left ventricular ejection fraction, by estimation, is 20 to 25%. The  left ventricle has severely decreased function. The left ventricle  demonstrates global hypokinesis. The left ventricular internal cavity size  was mildly to moderately  dilated. Left  ventricular diastolic function could not be evaluated.   2. Right ventricular systolic function is moderately reduced. The right  ventricular size is normal. Tricuspid regurgitation signal is inadequate  for assessing PA pressure.   3. Left atrial size was moderately dilated.   4. A small pericardial  effusion is present.   5. The mitral valve is degenerative. Trivial mitral valve regurgitation.  Moderate mitral annular calcification.   6. The aortic valve is tricuspid. There is mild calcification of the  aortic valve.   7. The inferior vena cava is dilated in size with <50% respiratory  variability, suggesting right atrial pressure of 15 mmHg.    Physical Exam:   VS:  BP 100/60 (BP Location: Left Arm, Patient Position: Sitting, Cuff Size: Normal)   Pulse 71   Ht 5\' 8"  (1.727 m)   Wt 164 lb (74.4 kg)   SpO2 97%   BMI 24.94 kg/m    Wt Readings from Last 3 Encounters:  09/19/23 164 lb (74.4 kg)  09/11/23 163 lb 1.6 oz (74 kg)  07/02/23 190 lb 14.7 oz (86.6 kg)    GEN: Well nourished, well developed in no acute distress NECK: No JVD; No carotid bruits CARDIAC: Distant heart sounds, IRRR, no murmurs, rubs, gallops RESPIRATORY:  Clear to auscultation without rales, wheezing or rhonchi  ABDOMEN: Soft, non-tender, non-distended EXTREMITIES:  No edema; No deformity   ASSESSMENT AND PLAN: .    Nonischemic cardiomyopathy: EF per echocardiogram 20-25%.  I have explained to him his echocardiogram and ejection fraction.  I have explained to him about his heart weakness and need to avoid salt but to also have good nutrition.  He is on guideline directed medical therapy with exception of Entresto.  He is on losartan.  Blood pressure is too low at this time to change losartan to [facility].  He will need follow-up BMET on office visit to follow.  He is advised on weighing daily, if he gains 3 to 5 pounds in 2 to 3 days he is to take an additional Lasix and report this to [place].  2.  Nonobstructive CAD: Cardiac catheterization was completed on 09/12/2023, proximal RCA 60% stenosis with less than 60% on other vessels range between 50 and 20%.  Please see cardiac catheterization report above.  I explained this to the patient and let him know that we would be treating him medically on statin therapy and  aspirin as directed.  I have given him a copy of his cardiac catheterization illustration and pointed out where the blockages are so that he has a better understanding.  He will need a repeat echocardiogram in 3 months.  3.  Hypercholesterolemia: continue pravastatin 40 mg daily.  Goal of LDL < 70. Most recent LDL 09/11/2023 84. Follow up labs in 3 months.        Signed, Bettey Mare. Liborio Nixon, ANP, AACC

## 2023-09-19 ENCOUNTER — Ambulatory Visit: Payer: No Typology Code available for payment source | Attending: Adult Health | Admitting: Adult Health

## 2023-09-19 ENCOUNTER — Encounter: Payer: Self-pay | Admitting: Adult Health

## 2023-09-19 VITALS — BP 100/60 | HR 71 | Ht 68.0 in | Wt 164.0 lb

## 2023-09-19 DIAGNOSIS — I5041 Acute combined systolic (congestive) and diastolic (congestive) heart failure: Secondary | ICD-10-CM | POA: Diagnosis not present

## 2023-09-19 DIAGNOSIS — I10 Essential (primary) hypertension: Secondary | ICD-10-CM | POA: Diagnosis not present

## 2023-09-19 NOTE — Patient Instructions (Signed)
Medication Instructions:  No Changes *If you need a refill on your cardiac medications before your next appointment, please call your pharmacy*   Lab Work: No labs If you have labs (blood work) drawn today and your tests are completely normal, you will receive your results only by: MyChart Message (if you have MyChart) OR A paper copy in the mail If you have any lab test that is abnormal or we need to change your treatment, we will call you to review the results.   Testing/Procedures: [address], [address]. Your physician has requested that you have an echocardiogram. Echocardiography is a painless test that uses sound waves to create images of your heart. It provides your doctor with information about the size and shape of your heart and how well your heart's chambers and valves are working. This procedure takes approximately one hour. There are no restrictions for this procedure. Please do NOT wear cologne, perfume, aftershave, or lotions (deodorant is allowed). Please arrive 15 minutes prior to your appointment time.  Please note: We ask at that you not bring children with you during ultrasound (echo/ vascular) testing. Due to room size and safety concerns, children are not allowed in the ultrasound rooms during exams. Our front office staff cannot provide observation of children in our lobby area while testing is being conducted. An adult accompanying a patient to their appointment will only be allowed in the ultrasound room at the discretion of the ultrasound technician under special circumstances. We apologize for any inconvenience.    Follow-Up: At [facility], you and your health needs are our priority.  As part of our continuing mission to provide you with exceptional heart care, we have created designated Provider Care Teams.  These Care Teams include your primary Cardiologist (physician) and Advanced Practice Providers (APPs -  Physician Assistants and Nurse  Practitioners) who all work together to provide you with the care you need, when you need it.  We recommend signing up for the patient portal called "MyChart".  Sign up information is provided on this After Visit Summary.  MyChart is used to connect with patients for Virtual Visits (Telemedicine).  Patients are able to view lab/test results, encounter notes, upcoming appointments, etc.  Non-urgent messages can be sent to your provider as well.   To learn more about what you can do with MyChart, go to [email or web address].    Your next appointment:   3 month(s)  Provider:   Joni Reining, DNP, ANP    or, Rollene Rotunda, MD

## 2023-09-21 DIAGNOSIS — G250 Essential tremor: Secondary | ICD-10-CM | POA: Diagnosis not present

## 2023-09-21 DIAGNOSIS — K219 Gastro-esophageal reflux disease without esophagitis: Secondary | ICD-10-CM | POA: Diagnosis not present

## 2023-09-21 DIAGNOSIS — C61 Malignant neoplasm of prostate: Secondary | ICD-10-CM | POA: Diagnosis not present

## 2023-09-21 DIAGNOSIS — Z09 Encounter for follow-up examination after completed treatment for conditions other than malignant neoplasm: Secondary | ICD-10-CM | POA: Diagnosis not present

## 2023-09-21 DIAGNOSIS — I10 Essential (primary) hypertension: Secondary | ICD-10-CM | POA: Diagnosis not present

## 2023-09-21 DIAGNOSIS — Z8639 Personal history of other endocrine, nutritional and metabolic disease: Secondary | ICD-10-CM | POA: Diagnosis not present

## 2023-09-21 DIAGNOSIS — I5023 Acute on chronic systolic (congestive) heart failure: Secondary | ICD-10-CM | POA: Diagnosis not present

## 2023-09-23 ENCOUNTER — Encounter (HOSPITAL_COMMUNITY): Payer: No Typology Code available for payment source

## 2023-10-05 ENCOUNTER — Encounter (HOSPITAL_COMMUNITY): Payer: No Typology Code available for payment source

## 2023-10-10 ENCOUNTER — Ambulatory Visit (HOSPITAL_COMMUNITY)
Admission: RE | Admit: 2023-10-10 | Discharge: 2023-10-10 | Disposition: A | Discharge status: Home / Self Care | Payer: PPO | Source: Ambulatory Visit | Attending: Physician Assistant | Admitting: Physician Assistant

## 2023-10-10 ENCOUNTER — Inpatient Hospital Stay (HOSPITAL_COMMUNITY)
Admission: RE | Admit: 2023-10-10 | Discharge: 2023-10-10 | Disposition: A | Discharge status: Home / Self Care | Payer: No Typology Code available for payment source | Source: Ambulatory Visit | Attending: Physician Assistant | Admitting: Physician Assistant

## 2023-10-10 ENCOUNTER — Other Ambulatory Visit (HOSPITAL_COMMUNITY): Payer: Self-pay | Admitting: Physician Assistant

## 2023-10-10 ENCOUNTER — Encounter (HOSPITAL_COMMUNITY): Payer: Self-pay

## 2023-10-10 VITALS — Ht 68.0 in | Wt 166.8 lb

## 2023-10-10 DIAGNOSIS — Z7982 Long term (current) use of aspirin: Secondary | ICD-10-CM | POA: Insufficient documentation

## 2023-10-10 DIAGNOSIS — I5020 Unspecified systolic (congestive) heart failure: Secondary | ICD-10-CM | POA: Diagnosis not present

## 2023-10-10 DIAGNOSIS — I2583 Coronary atherosclerosis due to lipid rich plaque: Secondary | ICD-10-CM | POA: Diagnosis not present

## 2023-10-10 DIAGNOSIS — Z8616 Personal history of COVID-19: Secondary | ICD-10-CM | POA: Insufficient documentation

## 2023-10-10 DIAGNOSIS — I493 Ventricular premature depolarization: Secondary | ICD-10-CM

## 2023-10-10 DIAGNOSIS — I110 Hypertensive heart disease with heart failure: Secondary | ICD-10-CM | POA: Diagnosis not present

## 2023-10-10 DIAGNOSIS — E1140 Type 2 diabetes mellitus with diabetic neuropathy, unspecified: Secondary | ICD-10-CM | POA: Insufficient documentation

## 2023-10-10 DIAGNOSIS — Z79899 Other long term (current) drug therapy: Secondary | ICD-10-CM | POA: Insufficient documentation

## 2023-10-10 DIAGNOSIS — I2510 Atherosclerotic heart disease of native coronary artery without angina pectoris: Secondary | ICD-10-CM | POA: Insufficient documentation

## 2023-10-10 DIAGNOSIS — Z7984 Long term (current) use of oral hypoglycemic drugs: Secondary | ICD-10-CM | POA: Diagnosis not present

## 2023-10-10 DIAGNOSIS — E785 Hyperlipidemia, unspecified: Secondary | ICD-10-CM | POA: Diagnosis not present

## 2023-10-10 DIAGNOSIS — I428 Other cardiomyopathies: Secondary | ICD-10-CM | POA: Diagnosis not present

## 2023-10-10 DIAGNOSIS — I5022 Chronic systolic (congestive) heart failure: Secondary | ICD-10-CM | POA: Insufficient documentation

## 2023-10-10 LAB — COMPREHENSIVE METABOLIC PANEL
ALT: 20 U/L (ref 0–44)
AST: 23 U/L (ref 15–41)
Albumin: 3.9 g/dL (ref 3.5–5.0)
Alkaline Phosphatase: 64 U/L (ref 38–126)
Anion gap: 10 (ref 5–15)
BUN: 19 mg/dL (ref 8–23)
CO2: 27 mmol/L (ref 22–32)
Calcium: 9.9 mg/dL (ref 8.9–10.3)
Chloride: 102 mmol/L (ref 98–111)
Creatinine, Ser: 1.05 mg/dL (ref 0.61–1.24)
GFR, Estimated: >60 mL/min (ref >60)
Glucose, Bld: 101 mg/dL — ABNORMAL HIGH (ref 70–99)
Potassium: 3.8 mmol/L (ref 3.5–5.1)
Sodium: 139 mmol/L (ref 135–145)
Total Bilirubin: 1.8 mg/dL — ABNORMAL HIGH (ref <1.2)
Total Protein: 7.5 g/dL (ref 6.5–8.1)

## 2023-10-10 LAB — BRAIN NATRIURETIC PEPTIDE: B Natriuretic Peptide: 1283.2 pg/mL — ABNORMAL HIGH (ref 0.0–100.0)

## 2023-10-10 MED ORDER — ONE-DAILY MULTI VITAMINS PO TABS: 1.0000 | ORAL_TABLET | Freq: Every day | ORAL | Status: AC

## 2023-10-10 MED ORDER — OMEPRAZOLE MAGNESIUM 20 MG PO TBEC: 20.0000 mg | DELAYED_RELEASE_TABLET | ORAL | Status: AC | PRN

## 2023-10-10 MED ORDER — CARBOXYMETHYLCELLULOSE SODIUM 0.5 % OP SOLN: 1.0000 [drp] | Freq: Two times a day (BID) | OPHTHALMIC | Status: AC

## 2023-10-10 MED ORDER — FUROSEMIDE 20 MG PO TABS: 20.0000 mg | ORAL_TABLET | Freq: Every day | ORAL | 3 refills | Status: DC

## 2023-10-10 MED ORDER — FISH OIL 1000 MG PO CAPS: 1000.0000 mg | ORAL_CAPSULE | Freq: Two times a day (BID) | ORAL | Status: AC
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
## 2023-10-10 NOTE — Patient Instructions (Signed)
Decrease Lasix to 20 mg daily - new Rx sent to your local pharmacy. You may take and extra 20 mg if weight gain of 3 lbs or more in 24 hrs or weight gain of 5 lbs in one week, increased swelling, or increased shortness of breath. Labs today - will call you if abnormal. We have placed a 14 day Zio (heart monitor) on you today. Return to see Dr. Shirlee Latch in 4 - 6 weeks - see below. Please bring your medications or medication list with you to this visit.  Please call us at [phone] if any questions or concerns.    Your provider has recommended that  you wear a Zio Patch for 14 days.  This monitor will record your heart rhythm for our review.  IF you have any symptoms while wearing the monitor please press the button.  If you have any issues with the patch or you notice a red or orange light on it please call the company at [phone].  Once you remove the patch please mail it back to the company as soon as possible so we can get the results.  Zio patch placed onto patient.  All instructions and information reviewed with patient, they verbalize understanding with no questions.

## 2023-10-12 ENCOUNTER — Telehealth (HOSPITAL_COMMUNITY): Payer: Self-pay

## 2023-10-12 DIAGNOSIS — I5020 Unspecified systolic (congestive) heart failure: Secondary | ICD-10-CM

## 2023-10-12 MED ORDER — FUROSEMIDE 20 MG PO TABS: ORAL_TABLET | ORAL | 3 refills | Status: DC

## 2023-10-12 NOTE — Telephone Encounter (Signed)
-----   Message from [facility] [place] [facility] sent at 10/12/2023  6:56 AM EST ----- BNP coming down, kidney function stable. Diuretic cut back at today's visit. Instead of cutting to 20 mg lasix daily please instruct him to take 40 mg alternating with 20 mg every other day.

## 2023-10-12 NOTE — Telephone Encounter (Signed)
Patient advised and verbalized understanding. Med list updated to reflect changes.   Meds ordered this encounter  Medications   furosemide (LASIX) 20 MG tablet    Sig: Take 40mg  (2 tablets) daily alternating with 20mg  (1 tablet) daily. May take an additional Lasix 20 mg if weight gain of 3 lbs in 24 hrs or 5 lbs in one week, increased swelling, or increased shortness of breath    Dispense:  100 tablet    Refill:  3    Please cancel all previous orders for current medication. Change in dosage or pill size.

## 2023-11-08 ENCOUNTER — Telehealth (HOSPITAL_COMMUNITY): Payer: Self-pay | Admitting: *Deleted

## 2023-11-08 DIAGNOSIS — I428 Other cardiomyopathies: Secondary | ICD-10-CM

## 2023-11-08 DIAGNOSIS — I5020 Unspecified systolic (congestive) heart failure: Secondary | ICD-10-CM

## 2023-11-08 DIAGNOSIS — I493 Ventricular premature depolarization: Secondary | ICD-10-CM

## 2023-11-08 MED ORDER — AMIODARONE HCL 200 MG PO TABS: ORAL_TABLET | ORAL | 0 refills | Status: DC

## 2023-11-08 MED ORDER — AMIODARONE HCL 200 MG PO TABS: 200.0000 mg | ORAL_TABLET | Freq: Every day | ORAL | 3 refills | Status: DC

## 2023-11-08 NOTE — Telephone Encounter (Signed)
 Called patient per Morna Dutch, PA with following heart monitor results and instructions:  Please update patient his heart monitor showed very frequent PVCs, 19.6% of beats. He also had some runs of SVT, but they were very brief. Either of these could explain fluctuations in his heart rate at home.   I discussed with Dr. Rolan. Start amiodarone  200 mg twice a day for 2 weeks then reduce to 200 mg daily.  Pt verbalized understanding and agreement to same. Rx sent to local pharmacy. Reminded of next appointment with Dr. Rolan on 11/28/23.

## 2023-11-28 ENCOUNTER — Ambulatory Visit (HOSPITAL_COMMUNITY)
Admission: RE | Admit: 2023-11-28 | Discharge: 2023-11-28 | Disposition: A | Discharge status: Home / Self Care | Payer: PPO | Source: Ambulatory Visit | Attending: Cardiology | Admitting: Cardiology

## 2023-11-28 VITALS — BP 134/70 | HR 38 | Wt 170.4 lb

## 2023-11-28 DIAGNOSIS — I5022 Chronic systolic (congestive) heart failure: Secondary | ICD-10-CM | POA: Diagnosis not present

## 2023-11-28 DIAGNOSIS — I493 Ventricular premature depolarization: Secondary | ICD-10-CM

## 2023-11-28 DIAGNOSIS — R001 Bradycardia, unspecified: Secondary | ICD-10-CM | POA: Insufficient documentation

## 2023-11-28 DIAGNOSIS — Z7982 Long term (current) use of aspirin: Secondary | ICD-10-CM | POA: Insufficient documentation

## 2023-11-28 DIAGNOSIS — Z79899 Other long term (current) drug therapy: Secondary | ICD-10-CM | POA: Insufficient documentation

## 2023-11-28 DIAGNOSIS — I428 Other cardiomyopathies: Secondary | ICD-10-CM | POA: Diagnosis not present

## 2023-11-28 DIAGNOSIS — Z7984 Long term (current) use of oral hypoglycemic drugs: Secondary | ICD-10-CM | POA: Insufficient documentation

## 2023-11-28 DIAGNOSIS — I2510 Atherosclerotic heart disease of native coronary artery without angina pectoris: Secondary | ICD-10-CM | POA: Insufficient documentation

## 2023-11-28 DIAGNOSIS — I5020 Unspecified systolic (congestive) heart failure: Secondary | ICD-10-CM | POA: Diagnosis not present

## 2023-11-28 LAB — CBC
HCT: 46 % (ref 39.0–52.0)
Hemoglobin: 15.2 g/dL (ref 13.0–17.0)
MCH: 28.8 pg (ref 26.0–34.0)
MCHC: 33 g/dL (ref 30.0–36.0)
MCV: 87.3 fL (ref 80.0–100.0)
Platelets: 232 10*3/uL (ref 150–400)
RBC: 5.27 MIL/uL (ref 4.22–5.81)
RDW: 18.4 % — ABNORMAL HIGH (ref 11.5–15.5)
WBC: 9.9 10*3/uL (ref 4.0–10.5)
nRBC: 0 % (ref 0.0–0.2)

## 2023-11-28 LAB — COMPREHENSIVE METABOLIC PANEL
ALT: 27 U/L (ref 0–44)
AST: 27 U/L (ref 15–41)
Albumin: 4 g/dL (ref 3.5–5.0)
Alkaline Phosphatase: 52 U/L (ref 38–126)
Anion gap: 11 (ref 5–15)
BUN: 29 mg/dL — ABNORMAL HIGH (ref 8–23)
CO2: 25 mmol/L (ref 22–32)
Calcium: 9.5 mg/dL (ref 8.9–10.3)
Chloride: 100 mmol/L (ref 98–111)
Creatinine, Ser: 1.29 mg/dL — ABNORMAL HIGH (ref 0.61–1.24)
GFR, Estimated: 55 mL/min — ABNORMAL LOW (ref >60)
Glucose, Bld: 111 mg/dL — ABNORMAL HIGH (ref 70–99)
Potassium: 4.8 mmol/L (ref 3.5–5.1)
Sodium: 136 mmol/L (ref 135–145)
Total Bilirubin: 1.4 mg/dL — ABNORMAL HIGH (ref 0.0–1.2)
Total Protein: 7.2 g/dL (ref 6.5–8.1)

## 2023-11-28 LAB — LIPID PANEL
Cholesterol: 114 mg/dL (ref 0–200)
HDL: 55 mg/dL (ref >40)
LDL Cholesterol: 44 mg/dL (ref 0–99)
Total CHOL/HDL Ratio: 2.1 {ratio}
Triglycerides: 74 mg/dL (ref <150)
VLDL: 15 mg/dL (ref 0–40)

## 2023-11-28 LAB — TSH: TSH: 2.385 u[IU]/mL (ref 0.350–4.500)

## 2023-11-28 LAB — BRAIN NATRIURETIC PEPTIDE: B Natriuretic Peptide: 489.4 pg/mL — ABNORMAL HIGH (ref 0.0–100.0)

## 2023-11-28 MED ORDER — EMPAGLIFLOZIN 10 MG PO TABS: 10.0000 mg | ORAL_TABLET | Freq: Every day | ORAL | 3 refills | Status: AC
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
## 2023-11-28 NOTE — Patient Instructions (Signed)
STOP Amiodarone  Labs done today, your results will be available in MyChart, we will contact you for abnormal readings.  Your physician has requested that you have a cardiac MRI. Cardiac MRI uses a computer to create images of your heart as its beating, producing both still and moving pictures of your heart and major blood vessels. For further information please visit [email or web address]. Please follow the instruction sheet given to you today for more information. YOU WILL BE CALLED TO HAVE THIS TEST ARRANGED.  Your physician recommends that you schedule a follow-up appointment in: 1 week  If you have any questions or concerns before your next appointment please send [place] a message through [place] or call our office at [phone].    TO LEAVE A MESSAGE FOR THE NURSE SELECT OPTION 2, PLEASE LEAVE A MESSAGE INCLUDING: YOUR NAME DATE OF BIRTH CALL BACK NUMBER REASON FOR CALL**this is important as we prioritize the call backs  YOU WILL RECEIVE A CALL BACK THE SAME DAY AS LONG AS YOU CALL BEFORE 4:00 PM  At the [facility], you and your health needs are our priority. As part of our continuing mission to provide you with exceptional heart care, we have created designated Provider Care Teams. These Care Teams include your primary Cardiologist (physician) and Advanced Practice Providers (APPs- Physician Assistants and Nurse Practitioners) who all work together to provide you with the care you need, when you need it.   You may see any of the following providers on your designated Care Team at your next follow up: Dr Arvilla Meres Dr Marca Ancona Dr. Dorthula Nettles Dr. Clearnce Hasten Amy Filbert Schilder, NP Robbie Lis, [place] [facility], [place] Brynda Peon, NP [place] Lee, NP Karle Plumber, PharmD   Please be sure to bring in all your medications bottles to every appointment.    Thank you for choosing [facility]
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
## 2023-12-07 ENCOUNTER — Encounter (HOSPITAL_COMMUNITY): Payer: Self-pay

## 2023-12-07 ENCOUNTER — Telehealth (HOSPITAL_COMMUNITY): Payer: Self-pay | Admitting: Pharmacy Technician

## 2023-12-07 ENCOUNTER — Ambulatory Visit (HOSPITAL_COMMUNITY)
Admission: RE | Admit: 2023-12-07 | Discharge: 2023-12-07 | Disposition: A | Discharge status: Home / Self Care | Payer: PPO | Source: Ambulatory Visit | Attending: Internal Medicine | Admitting: Internal Medicine

## 2023-12-07 ENCOUNTER — Other Ambulatory Visit (HOSPITAL_COMMUNITY): Payer: Self-pay

## 2023-12-07 VITALS — BP 142/72 | HR 60 | Wt 170.4 lb

## 2023-12-07 DIAGNOSIS — E119 Type 2 diabetes mellitus without complications: Secondary | ICD-10-CM | POA: Insufficient documentation

## 2023-12-07 DIAGNOSIS — E785 Hyperlipidemia, unspecified: Secondary | ICD-10-CM | POA: Insufficient documentation

## 2023-12-07 DIAGNOSIS — I5020 Unspecified systolic (congestive) heart failure: Secondary | ICD-10-CM

## 2023-12-07 DIAGNOSIS — I110 Hypertensive heart disease with heart failure: Secondary | ICD-10-CM | POA: Insufficient documentation

## 2023-12-07 DIAGNOSIS — I2583 Coronary atherosclerosis due to lipid rich plaque: Secondary | ICD-10-CM

## 2023-12-07 DIAGNOSIS — Z7984 Long term (current) use of oral hypoglycemic drugs: Secondary | ICD-10-CM | POA: Diagnosis not present

## 2023-12-07 DIAGNOSIS — I428 Other cardiomyopathies: Secondary | ICD-10-CM | POA: Insufficient documentation

## 2023-12-07 DIAGNOSIS — I2510 Atherosclerotic heart disease of native coronary artery without angina pectoris: Secondary | ICD-10-CM | POA: Diagnosis not present

## 2023-12-07 DIAGNOSIS — Z79899 Other long term (current) drug therapy: Secondary | ICD-10-CM | POA: Diagnosis not present

## 2023-12-07 DIAGNOSIS — I493 Ventricular premature depolarization: Secondary | ICD-10-CM | POA: Insufficient documentation

## 2023-12-07 DIAGNOSIS — I5022 Chronic systolic (congestive) heart failure: Secondary | ICD-10-CM | POA: Diagnosis not present

## 2023-12-07 DIAGNOSIS — M069 Rheumatoid arthritis, unspecified: Secondary | ICD-10-CM | POA: Diagnosis not present

## 2023-12-07 DIAGNOSIS — R001 Bradycardia, unspecified: Secondary | ICD-10-CM | POA: Diagnosis not present

## 2023-12-07 DIAGNOSIS — I459 Conduction disorder, unspecified: Secondary | ICD-10-CM | POA: Diagnosis not present

## 2023-12-07 DIAGNOSIS — I4892 Unspecified atrial flutter: Secondary | ICD-10-CM | POA: Diagnosis not present

## 2023-12-07 DIAGNOSIS — Z7901 Long term (current) use of anticoagulants: Secondary | ICD-10-CM | POA: Insufficient documentation

## 2023-12-07 DIAGNOSIS — I4439 Other atrioventricular block: Secondary | ICD-10-CM | POA: Diagnosis not present

## 2023-12-07 MED ORDER — APIXABAN 5 MG PO TABS: 5.0000 mg | ORAL_TABLET | Freq: Two times a day (BID) | ORAL | 3 refills | Status: DC

## 2023-12-07 NOTE — Patient Instructions (Signed)
 Medication Changes:  STOP ASPIRIN    START: ELIQUIS  5MG  TWICE DAILY   Testing/Procedures:  WE WILL CHECK ON ITAMAR SLEEP STUDY WITH INSURANCE AND LET YOU KNOW AT NEXT APPOINTMENT   Referrals:  YOU HAVE BEEN REFERRED TO ELECTROPHYSIOLOGY THEY WILL REACH OUT TO YOU OR CALL TO ARRANGE THIS. PLEASE CALL US  WITH ANY CONCERNS   Follow-Up in: 3 weeks as scheduled with Dr. Mclean   At the [facility], you and your health needs are our priority. We have a designated team specialized in the treatment of Heart Failure. This Care Team includes your primary Heart Failure Specialized Cardiologist (physician), Advanced Practice Providers (APPs- Physician Assistants and Nurse Practitioners), and Pharmacist who all work together to provide you with the care you need, when you need it.   You may see any of the following providers on your designated Care Team at your next follow up:  Dr. Toribio Fuel Dr. Ezra Shuck Dr. Ria Commander Dr. Odis Brownie Greig Mosses, NP Caffie Shed, [place] [facility], [place] Beckey Coe, NP Jordan Lee, NP Tinnie Redman, PharmD   Please be sure to bring in all your medications bottles to every appointment.   Need to Contact Us :  If you have any questions or concerns before your next appointment please send us  a message through [place] or call our office at [phone].    TO LEAVE A MESSAGE FOR THE NURSE SELECT OPTION 2, PLEASE LEAVE A MESSAGE INCLUDING: YOUR NAME DATE OF BIRTH CALL BACK NUMBER REASON FOR CALL**this is important as we prioritize the call backs  YOU WILL RECEIVE A CALL BACK THE SAME DAY AS LONG AS YOU CALL BEFORE 4:00 PM

## 2023-12-07 NOTE — Telephone Encounter (Signed)
 Pharmacy Patient Advocate Encounter  Insurance verification completed.   The patient is insured through [facility] ADVANTAGE/RX ADVANCE   Ran test claim for Eliquis . Currently a quantity of 180 is a 90 day supply and the co-pay is $117.50 (#60 for 30 days is $47).   This test claim was processed through [facility]- copay amounts may vary at other pharmacies due to pharmacy/plan contracts, or as the patient moves through the different stages of their insurance plan.   Almarie JULIANNA Pa, CPhT

## 2023-12-08 ENCOUNTER — Telehealth (HOSPITAL_COMMUNITY): Payer: Self-pay

## 2023-12-08 NOTE — Telephone Encounter (Signed)
-----   Message from CMA Philicia B sent at 12/08/2023 10:25 AM EST ----- Regarding: RE: ITAMAR No pre cert required. ----- Message ----- From: Tita Andriette NOVAK, RN Sent: 12/07/2023   1:48 PM EST To: Fausto JONELLE Ross, CMA Subject: VERNEDA Lob can you check pre cert for itamar for patient? Ordered by alma, thank you!

## 2023-12-08 NOTE — Telephone Encounter (Signed)
 Lucas Davis was ordered by May Sparks, NP at office visit yesterday but patient did not want to take device until prior auth was completed. Patient would like to pick up device and instructions at next follow up on 2/26 with Dr. Mclean - Dr. Charline Contras team made aware.

## 2023-12-13 ENCOUNTER — Ambulatory Visit (INDEPENDENT_AMBULATORY_CARE_PROVIDER_SITE_OTHER): Payer: PPO

## 2023-12-13 DIAGNOSIS — I5041 Acute combined systolic (congestive) and diastolic (congestive) heart failure: Secondary | ICD-10-CM

## 2023-12-13 DIAGNOSIS — I10 Essential (primary) hypertension: Secondary | ICD-10-CM | POA: Diagnosis not present

## 2023-12-13 LAB — ECHOCARDIOGRAM COMPLETE
AV Vena cont: 0.27 cm
Area-P 1/2: 3.54 cm2
Est EF: 55
S' Lateral: 3.78 cm

## 2023-12-13 NOTE — Progress Notes (Unsigned)
Cardiology Office Note:  .   Date:  12/15/2023  ID:  Lucas Davis, DOB February 28, 1941, MRN [id number] PCP: Linus Galas, NP  [facility] Providers Cardiologist:  Rollene Rotunda, MD1}   }   History of Present Illness: .   Lucas Davis is a 83 y.o. male with history of DM, HTN, HLD, rheumatoid arthritis, chronic systolic CHF/nonischemic cardiomyopathy, and frequent PVCs he is being followed by [facility], Dr. Ronelle Nigh, with echo revealing EF of 20 to 25% RV moderately reduced, moderate LAE, dilated IVC. R/LHC demonstrated nonobstructive CAD, on admission on 09/21/2023 for acute respiratory failure and hypoxia requiring BiPAP and acute CHF.  ZIO monitor in December 2024 revealed 19.6% PVCs and 147 beats short NSVT TEVAR on.  He was started on low-dose amiodarone to suppress the PVCs.  When he followed up with Dr. Shirlee Latch, his heart rate was in the 40s and he was noted to have junctional rhythm on EKG.  He was not placed on beta-blocker due to this bradycardia, he was to continue Lasix, Jardiance, and spironolactone.  There will be a reassessment of blood pressure the future and possibility of starting Entresto.  Amiodarone was discontinued due to junctional rhythm.  He was to follow-up in 1 week to reassess heart rate.  Today he comes completely asymptomatic.  He stopped taking his Eliquis for couple of days as his sister is recently been put in the hospital for GI bleed and his mother died of a GI bleed.  This was something that unnerved him for a few days and so he decided to stop the anticoagulation.  He denies any dizziness, near-syncope, fatigue, or chest pain.  He states he feels "lazy" but denies any significant fatigue.  ROS: As above otherwise negative  Studies Reviewed: Marland Kitchen    RHC/LHC 11/08/2022   Prox RCA lesion is 60% stenosed.   Mid RCA lesion is 50% stenosed.   Mid RCA to Dist RCA lesion is 40% stenosed.   RPDA lesion is 50% stenosed.   Ost Cx to Mid Cx lesion  is 20% stenosed.   2nd Mrg lesion is 30% stenosed.   Lat 2nd Mrg lesion is 30% stenosed.   Ost LAD to Mid LAD lesion is 50% stenosed.   Mid LAD to Dist LAD lesion is 30% stenosed.   Moderate non-obstructive throughout the proximal LAD.  Mild non-obstructive disease in the Circumflex and large second obtuse marginal branch.  Large dominant RCA with heavy calcification throughout the proximal, mid and distal segments. Moderate non-flow limiting stenosis in the proximal vessel. Mild to moderate non-obstructive disease in the mid and distal vessel.  Elevated right and left heart pressures (RA 6, RV 54/3/10, PA 62/13 mean 33, PCWP 26, LV 145/10/27, Ao 144/84)    Cardiac output 4.7 L/min    Cardiac index 2.38    Recommendations: Medical management of non-obstructive CAD and non-ischemic cardiomyopathy.  Echocardiogram 09/08/2023 1. Left ventricular ejection fraction, by estimation, is 20 to 25%. The  left ventricle has severely decreased function. The left ventricle  demonstrates global hypokinesis. The left ventricular internal cavity size  was mildly to moderately dilated. Left  ventricular diastolic function could not be evaluated.   2. Right ventricular systolic function is moderately reduced. The right  ventricular size is normal. Tricuspid regurgitation signal is inadequate  for assessing PA pressure.   3. Left atrial size was moderately dilated.   4. A small pericardial effusion is present.   5. The mitral valve is  degenerative. Trivial mitral valve regurgitation.  Moderate mitral annular calcification.   6. The aortic valve is tricuspid. There is mild calcification of the  aortic valve.   7. The inferior vena cava is dilated in size with <50% respiratory  variability, suggesting right atrial pressure of 15 mmHg.    EKG Interpretation Date/Time:  Thursday December 15 2023 13:21:27 EST Ventricular Rate:  37 PR Interval:    QRS Duration:  130 QT Interval:  526 QTC  Calculation: 412 R Axis:   -50  Text Interpretation: Atrial fibrillation with slow ventricular response with a competing junctional pacemaker Left bundle branch block When compared with ECG of 07-Dec-2023 13:22, Atrial fibrillation has replaced Atrial flutter Vent. rate has decreased BY  44 BPM Left bundle branch block has replaced Non-specific intra-ventricular conduction block Confirmed by Joni Reining [phone]) on 12/15/2023 1:46:36 PM    Physical Exam:   VS:  BP 116/60 (BP Location: Left Arm, Patient Position: Sitting, Cuff Size: Normal)   Pulse (!) 37   Ht 5\' 8"  (1.727 m)   Wt 174 lb 12.8 oz (79.3 kg)   SpO2 96%   BMI 26.58 kg/m    Wt Readings from Last 3 Encounters:  12/15/23 174 lb 12.8 oz (79.3 kg)  12/07/23 170 lb 6.4 oz (77.3 kg)  11/28/23 170 lb 6.4 oz (77.3 kg)    GEN: Well nourished, well developed in no acute distress NECK: No JVD; No carotid bruits CARDIAC: IRRR, bradycardic no murmurs, rubs, gallops RESPIRATORY:  Clear to auscultation without rales, wheezing or rhonchi  ABDOMEN: Soft, non-tender, non-distended EXTREMITIES:  No edema; No deformity   ASSESSMENT AND PLAN: .    Bradycardia: EKG revealing significant bradycardia heart rate of 30, the patient has a A-fib with competing junctional pacemaker.  He is completely asymptomatic.  Denies chest pain dizziness presyncope or shortness of breath.  He has been off amiodarone since 11/28/2023.  He is not on any AV nodal blocking agents.  He has an appointment with Dr. Lalla Brothers, electrophysiologist, on February 21 at 8:30 in the morning.        I have explained to him if he becomes symptomatic such as syncopal episode, near syncopal episode, severe chest pain, severe dizziness, shortness of breath or profound fatigue he is to call us immediately.  At which point we may need to consider putting in pacemaker sooner than later.  Currently is not inclined to have a pacemaker placed unless it is absolutely necessary.   2.   Chronic combined nonischemic cardiomyopathy: Most recent echocardiogram revealed an EF of 20 to 25%.  Currently he is not a candidate for Entresto due to soft blood pressure.  He has been followed by Dr. Shirlee Latch, was taken off of amiodarone due to bradycardia.  He is going to see EP to probably discuss ICD, pacemaker in the setting of reduced EF along with bradycardia for both low heart rate and VT prophylaxis.  He is aware of signs and symptoms of being seen sooner than later to discuss this.  He remains on furosemide, Jardiance, and spironolactone and has no evidence of volume overload.  3.  NSVT: History of this per ZIO monitor December 2024 with 19.6% PVCs and 147 beats short NSVT.  He he is not a candidate for beta-blocker therapy due to significant bradycardia.  4.  Atrial fibrillation: EKG reveals atrial flutter with slow ventricular response.  He stopped Eliquis for couple of days due to emotional response to his sister having a GI bleed  with family history of GI bleed.  I have advised him to go back on Eliquis due to CVA risk, and he is willing to do so.  CHA2DS2-VASc score 4 with a 4.8% annual CVA risk.        Signed, Bettey Mare. Liborio Nixon, ANP, AACC

## 2023-12-14 ENCOUNTER — Telehealth (HOSPITAL_COMMUNITY): Payer: Self-pay | Admitting: *Deleted

## 2023-12-14 ENCOUNTER — Other Ambulatory Visit (HOSPITAL_COMMUNITY): Payer: Self-pay | Admitting: *Deleted

## 2023-12-14 DIAGNOSIS — R001 Bradycardia, unspecified: Secondary | ICD-10-CM

## 2023-12-14 DIAGNOSIS — I493 Ventricular premature depolarization: Secondary | ICD-10-CM

## 2023-12-14 DIAGNOSIS — I5020 Unspecified systolic (congestive) heart failure: Secondary | ICD-10-CM

## 2023-12-14 NOTE — Telephone Encounter (Signed)
Called [facility] EP office per Dr. Shirlee Latch with following message:  "Please make sure he has appointment with EP because of bradycardia and PVCs.  HR has been pretty slow, would like appointment soon."  Confirmed patient has appointment with EP tomorrow 12/15/23.

## 2023-12-15 ENCOUNTER — Encounter: Payer: Self-pay | Admitting: Adult Health

## 2023-12-15 ENCOUNTER — Ambulatory Visit: Payer: No Typology Code available for payment source | Attending: Adult Health | Admitting: Adult Health

## 2023-12-15 VITALS — BP 116/60 | HR 37 | Ht 68.0 in | Wt 174.8 lb

## 2023-12-15 DIAGNOSIS — I482 Chronic atrial fibrillation, unspecified: Secondary | ICD-10-CM

## 2023-12-15 DIAGNOSIS — I5033 Acute on chronic diastolic (congestive) heart failure: Secondary | ICD-10-CM

## 2023-12-15 DIAGNOSIS — I43 Cardiomyopathy in diseases classified elsewhere: Secondary | ICD-10-CM

## 2023-12-15 DIAGNOSIS — I4729 Other ventricular tachycardia: Secondary | ICD-10-CM | POA: Diagnosis not present

## 2023-12-15 DIAGNOSIS — I10 Essential (primary) hypertension: Secondary | ICD-10-CM | POA: Diagnosis not present

## 2023-12-15 NOTE — Patient Instructions (Signed)
Medication Instructions:  No Changes *If you need a refill on your cardiac medications before your next appointment, please call your pharmacy*   Lab Work: No labs If you have labs (blood work) drawn today and your tests are completely normal, you will receive your results only by: MyChart Message (if you have MyChart) OR A paper copy in the mail If you have any lab test that is abnormal or we need to change your treatment, we will call you to review the results.   Testing/Procedures: No Testing   Follow-Up: At [facility], you and your health needs are our priority.  As part of our continuing mission to provide you with exceptional heart care, we have created designated Provider Care Teams.  These Care Teams include your primary Cardiologist (physician) and Advanced Practice Providers (APPs -  Physician Assistants and Nurse Practitioners) who all work together to provide you with the care you need, when you need it.  We recommend signing up for the patient portal called "MyChart".  Sign up information is provided on this After Visit Summary.  MyChart is used to connect with patients for Virtual Visits (Telemedicine).  Patients are able to view lab/test results, encounter notes, upcoming appointments, etc.  Non-urgent messages can be sent to your provider as well.   To learn more about what you can do with MyChart, go to [email or web address].    Your next appointment:   Keep Scheduled Appointment   Provider:   Steffanie Dunn, MD

## 2023-12-19 ENCOUNTER — Telehealth: Payer: Self-pay

## 2023-12-19 NOTE — Telephone Encounter (Addendum)
 Called patient regarding results. Left detailed message for patient regarding results. Letter mailed to patient 12/19/23. ----- Message from Joni Reining sent at 12/15/2023  7:51 AM EST ----- I have reviewed the echo and heart pumping function is normal, some thickening of the heart valves, especially the aortic valve. Some small leaking of the mitral valve but not significant.   KL

## 2023-12-22 DIAGNOSIS — I5023 Acute on chronic systolic (congestive) heart failure: Secondary | ICD-10-CM | POA: Diagnosis not present

## 2023-12-22 DIAGNOSIS — Z8639 Personal history of other endocrine, nutritional and metabolic disease: Secondary | ICD-10-CM | POA: Diagnosis not present

## 2023-12-22 DIAGNOSIS — I10 Essential (primary) hypertension: Secondary | ICD-10-CM | POA: Diagnosis not present

## 2023-12-22 NOTE — Progress Notes (Unsigned)
 Electrophysiology Office Note:    Date:  12/23/2023   ID:  Bernal, Luhman [birth year], MRN [id number]  [facility] Cardiologist:  Rollene Rotunda, MD  [facility] Electrophysiologist:  Lanier Prude, MD   Referring MD: Laurey Morale, MD   Chief Complaint: Chronic systolic heart failure, bradycardia, PVCs  History of Present Illness:    Lucas Davis is an 83 year old man who I am seeing today for an evaluation of chronic systolic heart failure, bradycardia and PVCs at the request of Dr. Lucien Mons.  The patient Lucas Davis November 28, 2023.  The patient's medical history includes rheumatoid arthritis, hyperlipidemia, prostate cancer, diabetes, GERD, PVCs and heart failure.  An echo in November 2024 showed an EF of 20 to 25% with moderately reduced RV function.  He has nonobstructive coronary disease on 10 September 2019 for left heart catheterization.  A ZIO monitor in December 2024 showed a PVC burden of 19.6%. He was previously on amiodarone but this was stopped given significant bradycardia and junctional rhythm at his January appointment.  He is doing well today.  No presyncope or syncope.  Does feel fatigued.  Reports falling asleep anytime he sits down for any prolonged amount of time.     Their past medical, social and family history was reviewed.   ROS:   Please see the history of present illness.    All other systems reviewed and are negative.  EKGs/Labs/Other Studies Reviewed:    The following studies were reviewed today:  December 15, 2023 EKG shows sinus bradycardia, competing junctional, ventricular rate 37 bpm.  QRS duration 130 ms.  December 07, 2023 EKG shows atrial flutter with variable AV conduction.  QRS duration 126 ms.  PVC.  November 28, 2023 EKG shows junctional rhythm, ventricular rate 45 bpm  October 10, 2023 EKG shows sinus rhythm PVCs, first-degree AV delay  September 19, 2023 EKG shows sinus rhythm, PVCs (multiple  morphologies)  September 07, 2023 EKG shows sinus rhythm, frequent monomorphic PVCs.  June 28, 2023 EKG shows junctional rhythm, ventricular rate 38 bpm  December 13, 2023 echo EF 55% Heart rate during echo 33 bpm  September 08, 2023 echo shows EF 20 to 25% Global hypokinesis RV moderately reduced  November 06, 2023 ZIO monitor personally reviewed PVCs 19.6%         Physical Exam:    VS:  BP 114/60   Pulse (!) 54   Ht 5\' 8"  (1.727 m)   Wt 170 lb 12.8 oz (77.5 kg)   SpO2 97%   BMI 25.97 kg/m     Wt Readings from Last 3 Encounters:  12/23/23 170 lb 12.8 oz (77.5 kg)  12/15/23 174 lb 12.8 oz (79.3 kg)  12/07/23 170 lb 6.4 oz (77.3 kg)     GEN: no distress. Elderly. CARD: RRR, No MRG RESP: No IWOB. CTAB.        ASSESSMENT AND PLAN:    1. HFrEF (heart failure with reduced ejection fraction) (HCC)   2. PVC's (premature ventricular contractions)   3. Tachycardia-bradycardia syndrome (HCC)     #Tachycardia-bradycardia syndrome #Chronic systolic heart failure #Frequent PVCs The patient has chronic systolic heart failure with an EF several months ago measured at 20 to 25%.  This is thought to be PVC related.  Unfortunately, after suppressing the PVC he became profoundly bradycardic with ventricular rates in the 30s and a junctional rhythm on his twelve-lead EKG.  He will need pacing support to accommodate the use of amiodarone.  Given his history of reduced EF, plan for a biventricular device.  Given his EF has improved with suppression of the PVC and with his advanced age, favor this being a CRT-P rather than a CRT-D. This has been discussed with the patient.  After implanting CRT-P, favor restarting amiodarone.  Risks, benefits, alternatives to PPM implantation were discussed in detail with the patient today. The patient understands that the risks include but are not limited to bleeding, infection, pneumothorax, perforation, tamponade, vascular damage, renal failure,  MI, stroke, death, and lead dislodgement and wishes to proceed.  We will therefore schedule device implantation at the next available time.  Hold Eliquis for 3 days prior to the procedure.  Plan for [employer].    #Atrial flutter Eliquis as above    Signed, Sheria Lang T. Lalla Brothers, MD, [facility], [facility] 12/23/2023 8:29 AM    Electrophysiology [facility]

## 2023-12-22 NOTE — H&P (View-Only) (Signed)
 Electrophysiology Office Note:    Date:  12/23/2023   ID:  Bernal, Luhman [birth year], MRN [id number]  [facility] Cardiologist:  Rollene Rotunda, MD  [facility] Electrophysiologist:  Lanier Prude, MD   Referring MD: Laurey Morale, MD   Chief Complaint: Chronic systolic heart failure, bradycardia, PVCs  History of Present Illness:    Mr. Yackley is an 83 year old man who I am seeing today for an evaluation of chronic systolic heart failure, bradycardia and PVCs at the request of Dr. Lucien Mons.  The patient Lycelle Dr. Shirlee Latch November 28, 2023.  The patient's medical history includes rheumatoid arthritis, hyperlipidemia, prostate cancer, diabetes, GERD, PVCs and heart failure.  An echo in November 2024 showed an EF of 20 to 25% with moderately reduced RV function.  He has nonobstructive coronary disease on 10 September 2019 for left heart catheterization.  A ZIO monitor in December 2024 showed a PVC burden of 19.6%. He was previously on amiodarone but this was stopped given significant bradycardia and junctional rhythm at his January appointment.  He is doing well today.  No presyncope or syncope.  Does feel fatigued.  Reports falling asleep anytime he sits down for any prolonged amount of time.     Their past medical, social and family history was reviewed.   ROS:   Please see the history of present illness.    All other systems reviewed and are negative.  EKGs/Labs/Other Studies Reviewed:    The following studies were reviewed today:  December 15, 2023 EKG shows sinus bradycardia, competing junctional, ventricular rate 37 bpm.  QRS duration 130 ms.  December 07, 2023 EKG shows atrial flutter with variable AV conduction.  QRS duration 126 ms.  PVC.  November 28, 2023 EKG shows junctional rhythm, ventricular rate 45 bpm  October 10, 2023 EKG shows sinus rhythm PVCs, first-degree AV delay  September 19, 2023 EKG shows sinus rhythm, PVCs (multiple  morphologies)  September 07, 2023 EKG shows sinus rhythm, frequent monomorphic PVCs.  June 28, 2023 EKG shows junctional rhythm, ventricular rate 38 bpm  December 13, 2023 echo EF 55% Heart rate during echo 33 bpm  September 08, 2023 echo shows EF 20 to 25% Global hypokinesis RV moderately reduced  November 06, 2023 ZIO monitor personally reviewed PVCs 19.6%         Physical Exam:    VS:  BP 114/60   Pulse (!) 54   Ht 5\' 8"  (1.727 m)   Wt 170 lb 12.8 oz (77.5 kg)   SpO2 97%   BMI 25.97 kg/m     Wt Readings from Last 3 Encounters:  12/23/23 170 lb 12.8 oz (77.5 kg)  12/15/23 174 lb 12.8 oz (79.3 kg)  12/07/23 170 lb 6.4 oz (77.3 kg)     GEN: no distress. Elderly. CARD: RRR, No MRG RESP: No IWOB. CTAB.        ASSESSMENT AND PLAN:    1. HFrEF (heart failure with reduced ejection fraction) (HCC)   2. PVC's (premature ventricular contractions)   3. Tachycardia-bradycardia syndrome (HCC)     #Tachycardia-bradycardia syndrome #Chronic systolic heart failure #Frequent PVCs The patient has chronic systolic heart failure with an EF several months ago measured at 20 to 25%.  This is thought to be PVC related.  Unfortunately, after suppressing the PVC he became profoundly bradycardic with ventricular rates in the 30s and a junctional rhythm on his twelve-lead EKG.  He will need pacing support to accommodate the use of amiodarone.  Given his history of reduced EF, plan for a biventricular device.  Given his EF has improved with suppression of the PVC and with his advanced age, favor this being a CRT-P rather than a CRT-D. This has been discussed with the patient.  After implanting CRT-P, favor restarting amiodarone.  Risks, benefits, alternatives to PPM implantation were discussed in detail with the patient today. The patient understands that the risks include but are not limited to bleeding, infection, pneumothorax, perforation, tamponade, vascular damage, renal failure,  MI, stroke, death, and lead dislodgement and wishes to proceed.  We will therefore schedule device implantation at the next available time.  Hold Eliquis for 3 days prior to the procedure.  Plan for [employer].    #Atrial flutter Eliquis as above    Signed, Sheria Lang T. Lalla Brothers, MD, [facility] 12/23/2023 8:29 AM    Electrophysiology [facility]

## 2023-12-23 ENCOUNTER — Ambulatory Visit: Payer: No Typology Code available for payment source | Attending: Cardiology | Admitting: Cardiology

## 2023-12-23 ENCOUNTER — Encounter: Payer: Self-pay | Admitting: Cardiology

## 2023-12-23 VITALS — BP 114/60 | HR 54 | Ht 68.0 in | Wt 170.8 lb

## 2023-12-23 DIAGNOSIS — I493 Ventricular premature depolarization: Secondary | ICD-10-CM

## 2023-12-23 DIAGNOSIS — I5020 Unspecified systolic (congestive) heart failure: Secondary | ICD-10-CM | POA: Diagnosis not present

## 2023-12-23 DIAGNOSIS — I495 Sick sinus syndrome: Secondary | ICD-10-CM

## 2023-12-23 LAB — CBC WITH DIFFERENTIAL/PLATELET

## 2023-12-23 NOTE — Patient Instructions (Signed)
 Medication Instructions:  Your physician recommends that you continue on your current medications as directed. Please refer to the Current Medication list given to you today.  *If you need a refill on your cardiac medications before your next appointment, please call your pharmacy*   Lab Work: BMET and CBC If you have labs (blood work) drawn today and your tests are completely normal, you will receive your results only by: MyChart Message (if you have MyChart) OR A paper copy in the mail If you have any lab test that is abnormal or we need to change your treatment, we will call you to review the results.   Testing/Procedures: Pacemaker Implant Your physician has recommended that you have a pacemaker inserted. A pacemaker is a small device that is placed under the skin of your chest or abdomen to help control abnormal heart rhythms. This device uses electrical pulses to prompt the heart to beat at a normal rate. Pacemakers are used to treat heart rhythms that are too slow. Wire (leads) are attached to the pacemaker that goes into the chambers of you heart. This is done in the hospital and usually requires and overnight stay. Please see the instruction sheet given to you today for more information.   Follow-Up: At [facility], you and your health needs are our priority.  As part of our continuing mission to provide you with exceptional heart care, we have created designated Provider Care Teams.  These Care Teams include your primary Cardiologist (physician) and Advanced Practice Providers (APPs -  Physician Assistants and Nurse Practitioners) who all work together to provide you with the care you need, when you need it.  Your next appointment:   We will call you to arrange your post procedure follow ups

## 2023-12-24 LAB — CBC WITH DIFFERENTIAL/PLATELET
Basophils Absolute: 0 10*3/uL (ref 0.0–0.2)
Basos: 0 %
EOS (ABSOLUTE): 0.1 10*3/uL (ref 0.0–0.4)
Eos: 1 %
Hematocrit: 47.9 % (ref 37.5–51.0)
Hemoglobin: 15.7 g/dL (ref 13.0–17.7)
Immature Grans (Abs): 0 10*3/uL (ref 0.0–0.1)
Immature Granulocytes: 0 %
Lymphocytes Absolute: 3.9 10*3/uL — ABNORMAL HIGH (ref 0.7–3.1)
Lymphs: 41 %
MCH: 29.3 pg (ref 26.6–33.0)
MCHC: 32.8 g/dL (ref 31.5–35.7)
MCV: 89 fL (ref 79–97)
Monocytes Absolute: 1.1 10*3/uL — ABNORMAL HIGH (ref 0.1–0.9)
Monocytes: 12 %
Neutrophils Absolute: 4.2 10*3/uL (ref 1.4–7.0)
Neutrophils: 46 %
Platelets: 224 10*3/uL (ref 150–450)
RBC: 5.36 x10E6/uL (ref 4.14–5.80)
RDW: 16.8 % — ABNORMAL HIGH (ref 11.6–15.4)
WBC: 9.3 10*3/uL (ref 3.4–10.8)

## 2023-12-24 LAB — BASIC METABOLIC PANEL
BUN/Creatinine Ratio: 20 (ref 10–24)
BUN: 23 mg/dL (ref 8–27)
CO2: 23 mmol/L (ref 20–29)
Calcium: 9.8 mg/dL (ref 8.6–10.2)
Chloride: 102 mmol/L (ref 96–106)
Creatinine, Ser: 1.14 mg/dL (ref 0.76–1.27)
Glucose: 79 mg/dL (ref 70–99)
Potassium: 5.1 mmol/L (ref 3.5–5.2)
Sodium: 143 mmol/L (ref 134–144)
eGFR: 64 mL/min/{1.73_m2} (ref >59)

## 2023-12-28 ENCOUNTER — Encounter (HOSPITAL_COMMUNITY): Payer: Self-pay | Admitting: Cardiology

## 2023-12-28 ENCOUNTER — Ambulatory Visit (HOSPITAL_COMMUNITY)
Admission: RE | Admit: 2023-12-28 | Discharge: 2023-12-28 | Disposition: A | Discharge status: Home / Self Care | Payer: No Typology Code available for payment source | Source: Ambulatory Visit | Attending: Cardiology | Admitting: Cardiology

## 2023-12-28 VITALS — BP 102/62 | HR 50 | Wt 172.4 lb

## 2023-12-28 DIAGNOSIS — I428 Other cardiomyopathies: Secondary | ICD-10-CM | POA: Diagnosis not present

## 2023-12-28 DIAGNOSIS — I110 Hypertensive heart disease with heart failure: Secondary | ICD-10-CM | POA: Insufficient documentation

## 2023-12-28 DIAGNOSIS — Z8616 Personal history of COVID-19: Secondary | ICD-10-CM | POA: Diagnosis not present

## 2023-12-28 DIAGNOSIS — Z7984 Long term (current) use of oral hypoglycemic drugs: Secondary | ICD-10-CM | POA: Insufficient documentation

## 2023-12-28 DIAGNOSIS — Z79899 Other long term (current) drug therapy: Secondary | ICD-10-CM | POA: Diagnosis not present

## 2023-12-28 DIAGNOSIS — M069 Rheumatoid arthritis, unspecified: Secondary | ICD-10-CM | POA: Insufficient documentation

## 2023-12-28 DIAGNOSIS — I4892 Unspecified atrial flutter: Secondary | ICD-10-CM | POA: Diagnosis not present

## 2023-12-28 DIAGNOSIS — I2510 Atherosclerotic heart disease of native coronary artery without angina pectoris: Secondary | ICD-10-CM | POA: Diagnosis not present

## 2023-12-28 DIAGNOSIS — E119 Type 2 diabetes mellitus without complications: Secondary | ICD-10-CM | POA: Diagnosis not present

## 2023-12-28 DIAGNOSIS — Z7901 Long term (current) use of anticoagulants: Secondary | ICD-10-CM | POA: Diagnosis not present

## 2023-12-28 DIAGNOSIS — I5020 Unspecified systolic (congestive) heart failure: Secondary | ICD-10-CM | POA: Diagnosis not present

## 2023-12-28 DIAGNOSIS — E785 Hyperlipidemia, unspecified: Secondary | ICD-10-CM | POA: Diagnosis not present

## 2023-12-28 DIAGNOSIS — I493 Ventricular premature depolarization: Secondary | ICD-10-CM | POA: Diagnosis not present

## 2023-12-28 DIAGNOSIS — I5022 Chronic systolic (congestive) heart failure: Secondary | ICD-10-CM | POA: Diagnosis not present

## 2023-12-28 MED ORDER — VALSARTAN 40 MG PO TABS: 20.0000 mg | ORAL_TABLET | Freq: Every day | ORAL | 3 refills | Status: DC

## 2023-12-28 NOTE — Progress Notes (Signed)
 Height:     Weight: BMI:  Today's Date:  STOP BANG RISK ASSESSMENT S (snore) Have you been told that you snore?     NO   T (tired) Are you often tired, fatigued, or sleepy during the day?   YES  O (obstruction) Do you stop breathing, choke, or gasp during sleep? NO   P (pressure) Do you have or are you being treated for high blood pressure? YES   B (BMI) Is your body index greater than 35 kg/m? NO   A (age) Are you 83 years old or older? YES   N (neck) Do you have a neck circumference greater than 16 inches?   NO   G (gender) Are you a male? YES   TOTAL STOP/BANG "YES" ANSWERS 4                                                                       For Office Use Only              Procedure Order Form    YES to 3+ Stop Bang questions OR two clinical symptoms - patient qualifies for WatchPAT (CPT 95800)      Clinical Notes: Will consult Sleep Specialist and refer for management of therapy due to patient increased risk of Sleep Apnea. Ordering a sleep study due to the following two clinical symptoms: Excessive daytime sleepiness G47.10 / Difficulty concentrating R41.840 / Memory problems or poor judgment G31.84 / Personality changes or irritability R45.4 / Loud snoring R06.83 /  Unrefreshed by sleep G47.8 /  History of high blood pressure R03.0

## 2023-12-28 NOTE — Patient Instructions (Signed)
 START Valsartan 20 mg ( 1/2 Tab) daily.  Blood work in 10 days.  Your provider has recommended that you have a home sleep study ([facility]amar Test).  We have provided you with the equipment in our office today. Please go ahead and download the app. DO NOT OPEN OR TAMPER WITH THE BOX UNTIL WE ADVISE YOU TO DO SO. Once insurance has approved the test our office will call you with PIN number and approval to proceed with testing. Once you have completed the test you just dispose of the equipment, the information is automatically uploaded to [place] via blue-tooth technology. If your test is positive for sleep apnea and you need a home CPAP machine you will be contacted by Dr Norris Cross office [facility]) to set this up.   Your physician recommends that you schedule a follow-up appointment in: 6 weeks.  If you have any questions or concerns before your next appointment please send [place] a message through [place] or call our office at [phone].    TO LEAVE A MESSAGE FOR THE NURSE SELECT OPTION 2, PLEASE LEAVE A MESSAGE INCLUDING: YOUR NAME DATE OF BIRTH CALL BACK NUMBER REASON FOR CALL**this is important as we prioritize the call backs  YOU WILL RECEIVE A CALL BACK THE SAME DAY AS LONG AS YOU CALL BEFORE 4:00 PM  At the [facility], you and your health needs are our priority. As part of our continuing mission to provide you with exceptional heart care, we have created designated Provider Care Teams. These Care Teams include your primary Cardiologist (physician) and Advanced Practice Providers (APPs- Physician Assistants and Nurse Practitioners) who all work together to provide you with the care you need, when you need it.   You may see any of the following providers on your designated Care Team at your next follow up: Dr Arvilla Meres Dr Marca Ancona Dr. Dorthula Nettles Dr. Clearnce Hasten Amy Filbert Schilder, NP Robbie Lis, [facility] [place], [place] Brynda Peon,  NP [place] Lee, NP Clarisa Kindred, NP Karle Plumber, PharmD Enos Fling, PharmD   Please be sure to bring in all your medications bottles to every appointment.    Thank you for choosing [facility]

## 2023-12-29 DIAGNOSIS — G250 Essential tremor: Secondary | ICD-10-CM | POA: Diagnosis not present

## 2023-12-29 DIAGNOSIS — K219 Gastro-esophageal reflux disease without esophagitis: Secondary | ICD-10-CM | POA: Diagnosis not present

## 2023-12-29 DIAGNOSIS — C61 Malignant neoplasm of prostate: Secondary | ICD-10-CM | POA: Diagnosis not present

## 2023-12-29 DIAGNOSIS — E7800 Pure hypercholesterolemia, unspecified: Secondary | ICD-10-CM | POA: Diagnosis not present

## 2023-12-29 DIAGNOSIS — R7303 Prediabetes: Secondary | ICD-10-CM | POA: Diagnosis not present

## 2023-12-29 DIAGNOSIS — L4050 Arthropathic psoriasis, unspecified: Secondary | ICD-10-CM | POA: Diagnosis not present

## 2023-12-29 DIAGNOSIS — I10 Essential (primary) hypertension: Secondary | ICD-10-CM | POA: Diagnosis not present

## 2023-12-29 DIAGNOSIS — Z0000 Encounter for general adult medical examination without abnormal findings: Secondary | ICD-10-CM | POA: Diagnosis not present

## 2023-12-29 DIAGNOSIS — I5023 Acute on chronic systolic (congestive) heart failure: Secondary | ICD-10-CM | POA: Diagnosis not present
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
## 2024-01-06 ENCOUNTER — Encounter (HOSPITAL_COMMUNITY): Payer: Self-pay

## 2024-01-09 ENCOUNTER — Telehealth (HOSPITAL_COMMUNITY): Payer: Self-pay

## 2024-01-09 NOTE — Telephone Encounter (Signed)
 Attempted to reach patient to discuss upcoming procedure, no answer. Left VM for patient to return call.

## 2024-01-09 NOTE — Telephone Encounter (Signed)
 Call placed to patient to discuss upcoming procedure.   Confirmed patient is scheduled for a Biventricular permanent transvenous pacemaker on Monday, March 17 with Dr. Steffanie Dunn. Instructed patient to arrive at the Main Entrance A at [facility]: [address] [place], [place] [address] and check in at Admitting at 12:30 PM.   Patient verbalized that he only wants authorization to be submitted through his [place] insurance and not [employer]. If procedure is not approved by the [place], he would like to know because doesn't want to proceed. Will pass along information to the pre-certification team.   Any recent signs of acute illness or been started on antibiotics? No  Labs completed on 12/23/23 and acceptable.  Any new medications started? No Diabetic medications to hold? Jardiance 3 days Medication instructions:  On the morning of your procedure hold your Eliquis (Apixaban) for 3 day(s) prior to your procedure. Your last dose will be Thursday, March 13, PM dose. No eating or drinking after midnight prior to procedure.   The night before your procedure and the morning of your procedure scrub your neck/chest with the CHG surgical soap.   Advised of plan to go home the same day and will only stay overnight if medically necessary. You MUST have a responsible adult to drive you home and MUST be with you the first 24 hours after you arrive home.  Patient verbalized understanding to all instructions provided and agreed to proceed with procedure based on authorization.

## 2024-01-10 ENCOUNTER — Ambulatory Visit (HOSPITAL_BASED_OUTPATIENT_CLINIC_OR_DEPARTMENT_OTHER)
Admission: RE | Admit: 2024-01-10 | Discharge: 2024-01-10 | Disposition: A | Discharge status: Home / Self Care | Source: Ambulatory Visit | Attending: Cardiology | Admitting: Cardiology

## 2024-01-10 ENCOUNTER — Ambulatory Visit (HOSPITAL_COMMUNITY)
Admission: RE | Admit: 2024-01-10 | Discharge: 2024-01-10 | Disposition: A | Discharge status: Home / Self Care | Payer: No Typology Code available for payment source | Source: Ambulatory Visit | Attending: Cardiology | Admitting: Cardiology

## 2024-01-10 ENCOUNTER — Other Ambulatory Visit (HOSPITAL_COMMUNITY): Payer: Self-pay | Admitting: Cardiology

## 2024-01-10 DIAGNOSIS — I429 Cardiomyopathy, unspecified: Secondary | ICD-10-CM | POA: Insufficient documentation

## 2024-01-10 DIAGNOSIS — I493 Ventricular premature depolarization: Secondary | ICD-10-CM | POA: Diagnosis not present

## 2024-01-10 DIAGNOSIS — I5020 Unspecified systolic (congestive) heart failure: Secondary | ICD-10-CM | POA: Diagnosis not present

## 2024-01-10 LAB — BASIC METABOLIC PANEL
Anion gap: 8 (ref 5–15)
BUN: 23 mg/dL (ref 8–23)
CO2: 25 mmol/L (ref 22–32)
Calcium: 9.4 mg/dL (ref 8.9–10.3)
Chloride: 102 mmol/L (ref 98–111)
Creatinine, Ser: 1.22 mg/dL (ref 0.61–1.24)
GFR, Estimated: 59 mL/min — ABNORMAL LOW (ref >60)
Glucose, Bld: 162 mg/dL — ABNORMAL HIGH (ref 70–99)
Potassium: 3.8 mmol/L (ref 3.5–5.1)
Sodium: 135 mmol/L (ref 135–145)

## 2024-01-10 MED ORDER — GADOBUTROL 1 MMOL/ML IV SOLN
8.0000 mL | Freq: Once | INTRAVENOUS | Status: AC | PRN
  Administered 2024-01-10: 8 mL via INTRAVENOUS

## 2024-01-11 ENCOUNTER — Encounter (INDEPENDENT_AMBULATORY_CARE_PROVIDER_SITE_OTHER): Payer: Self-pay | Admitting: Cardiology

## 2024-01-11 DIAGNOSIS — G4733 Obstructive sleep apnea (adult) (pediatric): Secondary | ICD-10-CM | POA: Diagnosis not present

## 2024-01-11 NOTE — Telephone Encounter (Signed)
 Informed by patient account specialist, Harriett Rush., that the [facility] advised that patient will need to go through his PCP at the [facility] to get the process started for [facility] Ref[facility]. She has contacted the pt and advised him of this information.   Per patient, he has spoken with the [facility] but has decided to proceed with procedure using his [employer] insurance.

## 2024-01-12 ENCOUNTER — Ambulatory Visit: Attending: Cardiology

## 2024-01-12 DIAGNOSIS — I5020 Unspecified systolic (congestive) heart failure: Secondary | ICD-10-CM

## 2024-01-12 NOTE — Procedures (Signed)
   SLEEP STUDY REPORT Patient Information Study Date: 01/11/2024 Patient Name: Lucas Davis Patient ID: [id number] Birth Date: 1940-11-12 Age: 83 Gender: Male BMI: 26.1 (W=172 lb, H=5' 8'') Stopbang: 4 Referring Physician: Marca Ancona, MD  TEST DESCRIPTION: Home sleep apnea testing was completed using the WatchPat, a Type 1 device, utilizing peripheral arterial tonometry (PAT), chest movement, actigraphy, pulse oximetry, pulse rate, body position and snore. AHI was calculated with apnea and hypopnea using valid sleep time as the denominator. RDI includes apneas, hypopneas, and RERAs. The data acquired and the scoring of sleep and all associated events were performed in accordance with the recommended standards and specifications as outlined in the AASM Manual for the Scoring of Sleep and Associated Events 2.2.0 (2015).  FINDINGS:  1. Moderate Obstructive Sleep Apnea with AHI 23.4/hr.  2. No significant Central Sleep Apnea with pAHIc 7.9/hr.  3. Oxygen desaturations as low as 82%.  4. Severe snoring was present. O2 sats were < 88% for 2.9 min.  5. Total sleep time was 5 hrs and 12 min.  6. 19.8% of total sleep time was spent in REM sleep.  7. Shortened sleep onset latency at 9 min  8. Prolonged REM sleep onset latency at 311 min.  9. Total awakenings were 27. 10. Arrhythmia detection: Suggestive of possible brief atrial fibrillation lasting 40 seconds. This is not diagnostic and further testing with outpatient telemetry monitoring is recommended.  DIAGNOSIS: Moderate Obstructive Sleep Apnea (G47.33) Possible Atrial Fibrillation  RECOMMENDATIONS: 1. Clinical correlation of these findings is necessary. The decision to treat obstructive sleep apnea (OSA) is usually based on the presence of apnea symptoms or the presence of associated medical conditions such as Hypertension, Congestive Heart Failure, Atrial Fibrillation or Obesity. The most common symptoms of OSA are snoring,  gasping for breath while sleeping, daytime sleepiness and fatigue.  2. Initiating apnea therapy is recommended given the presence of symptoms and/or associated conditions. Recommend proceeding with one of the following:   a. Auto-CPAP therapy with a pressure range of 5-20cm H2O.   b. An oral appliance (OA) that can be obtained from certain dentists with expertise in sleep medicine. These are primarily of use in non-obese patients with mild and moderate disease.   c. An ENT consultation which may be useful to look for specific causes of obstruction and possible treatment options.   d. If patient is intolerant to PAP therapy, consider referral to ENT for evaluation for hypoglossal nerve stimulator.  3. Close follow-up is necessary to ensure success with CPAP or oral appliance therapy for maximum benefit .  4. A follow-up oximetry study on CPAP is recommended to assess the adequacy of therapy and determine the need for supplemental oxygen or the potential need for Bi-level therapy. An arterial blood gas to determine the adequacy of baseline ventilation and oxygenation should also be considered.  5. Healthy sleep recommendations include: adequate nightly sleep (normal 7-9 hrs/night), avoidance of caffeine after noon and alcohol near bedtime, and maintaining a sleep environment that is cool, dark and quiet.  6. Weight loss for overweight patients is recommended. Even modest amounts of weight loss can significantly improve the severity of sleep apnea.  7. Snoring recommendations include: weight loss where appropriate, side sleeping, and avoidance of alcohol before bed.  8. Operation of motor vehicle should not be performed when sleepy.  Signature: Armanda Magic, MD; [facility]; Diplomat, American Board of Sleep Medicine Electronically Signed: 01/12/2024 2:40:28 PM

## 2024-01-13 ENCOUNTER — Telehealth (HOSPITAL_COMMUNITY): Payer: Self-pay

## 2024-01-13 ENCOUNTER — Telehealth: Payer: Self-pay | Admitting: Cardiology

## 2024-01-13 NOTE — Telephone Encounter (Signed)
 Pt has upcoming procedure on Monday and has some questions regarding procedure and medications needing to stop.

## 2024-01-13 NOTE — Telephone Encounter (Addendum)
 Pt aware, agreeable, and verbalized understanding   ----- Message from Marca Ancona sent at 01/12/2024 10:46 PM EDT ----- LV EF 39%.  Scar pattern suggests prior MI involving the inferolateral wall, also a nonspecific pattern in the septum seen with dilated cardiomyopathies.

## 2024-01-13 NOTE — Telephone Encounter (Signed)
 Spoke with pt, medications discussed and all questions answered.

## 2024-01-15 NOTE — Pre-Procedure Instructions (Signed)
 Instructed patient on the following items: Arrival time 0730, new arrival time Nothing to eat or drink after midnight No meds AM of procedure Responsible person to drive you home and stay with you for 24 hrs Wash with special soap night before and morning of procedure If on anti-coagulant drug instructions Eliquis-last dose Thursday 3/13

## 2024-01-16 ENCOUNTER — Encounter (HOSPITAL_COMMUNITY): Payer: Self-pay | Admitting: Cardiology

## 2024-01-16 ENCOUNTER — Encounter (HOSPITAL_COMMUNITY)
Admission: RE | Disposition: A | Discharge status: Home / Self Care | Payer: Self-pay | Source: Home / Self Care | Attending: Cardiology

## 2024-01-16 ENCOUNTER — Ambulatory Visit (HOSPITAL_COMMUNITY)
Admission: RE | Admit: 2024-01-16 | Discharge: 2024-01-16 | Disposition: A | Discharge status: Home / Self Care | Payer: No Typology Code available for payment source | Attending: Cardiology | Admitting: Cardiology

## 2024-01-16 ENCOUNTER — Other Ambulatory Visit: Payer: Self-pay

## 2024-01-16 ENCOUNTER — Other Ambulatory Visit (HOSPITAL_COMMUNITY): Payer: Self-pay

## 2024-01-16 ENCOUNTER — Ambulatory Visit (HOSPITAL_COMMUNITY)

## 2024-01-16 DIAGNOSIS — I495 Sick sinus syndrome: Secondary | ICD-10-CM | POA: Diagnosis not present

## 2024-01-16 DIAGNOSIS — I493 Ventricular premature depolarization: Secondary | ICD-10-CM | POA: Insufficient documentation

## 2024-01-16 DIAGNOSIS — E119 Type 2 diabetes mellitus without complications: Secondary | ICD-10-CM | POA: Insufficient documentation

## 2024-01-16 DIAGNOSIS — E785 Hyperlipidemia, unspecified: Secondary | ICD-10-CM | POA: Diagnosis not present

## 2024-01-16 DIAGNOSIS — M069 Rheumatoid arthritis, unspecified: Secondary | ICD-10-CM | POA: Insufficient documentation

## 2024-01-16 DIAGNOSIS — I2510 Atherosclerotic heart disease of native coronary artery without angina pectoris: Secondary | ICD-10-CM | POA: Diagnosis not present

## 2024-01-16 DIAGNOSIS — Z7901 Long term (current) use of anticoagulants: Secondary | ICD-10-CM | POA: Insufficient documentation

## 2024-01-16 DIAGNOSIS — I5022 Chronic systolic (congestive) heart failure: Secondary | ICD-10-CM | POA: Insufficient documentation

## 2024-01-16 DIAGNOSIS — I4892 Unspecified atrial flutter: Secondary | ICD-10-CM | POA: Diagnosis not present

## 2024-01-16 DIAGNOSIS — Z8546 Personal history of malignant neoplasm of prostate: Secondary | ICD-10-CM | POA: Diagnosis not present

## 2024-01-16 DIAGNOSIS — Z950 Presence of cardiac pacemaker: Secondary | ICD-10-CM | POA: Diagnosis not present

## 2024-01-16 HISTORY — PX: BIV PACEMAKER INSERTION CRT-P: EP1199

## 2024-01-16 SURGERY — BIV PACEMAKER INSERTION CRT-P

## 2024-01-16 MED ORDER — SODIUM CHLORIDE 0.9 % IV SOLN
INTRAVENOUS | Status: AC
  Filled 2024-01-16: qty 2

## 2024-01-16 MED ORDER — FENTANYL CITRATE (PF) 100 MCG/2ML IJ SOLN
INTRAMUSCULAR | Status: AC
Start: 2024-01-16 — End: ?
  Filled 2024-01-16: qty 2

## 2024-01-16 MED ORDER — MIDAZOLAM HCL 5 MG/5ML IJ SOLN
INTRAMUSCULAR | Status: AC
  Filled 2024-01-16: qty 5

## 2024-01-16 MED ORDER — APIXABAN 5 MG PO TABS: 5.0000 mg | ORAL_TABLET | Freq: Two times a day (BID) | ORAL | 3 refills | Status: AC

## 2024-01-16 MED ORDER — FENTANYL CITRATE (PF) 100 MCG/2ML IJ SOLN
INTRAMUSCULAR | Status: DC | PRN
  Administered 2024-01-16 (×2): 25 ug via INTRAVENOUS

## 2024-01-16 MED ORDER — AMIODARONE HCL 200 MG PO TABS
ORAL_TABLET | ORAL | 0 refills | Status: DC
  Filled 2024-01-16: qty 73, 59d supply, fill #0

## 2024-01-16 MED ORDER — CEFAZOLIN SODIUM-DEXTROSE 2-4 GM/100ML-% IV SOLN
INTRAVENOUS | Status: AC
  Filled 2024-01-16: qty 100

## 2024-01-16 MED ORDER — IOHEXOL 350 MG/ML SOLN
INTRAVENOUS | Status: DC | PRN
  Administered 2024-01-16: 15 mL

## 2024-01-16 MED ORDER — LIDOCAINE HCL 1 % IJ SOLN
INTRAMUSCULAR | Status: AC
  Filled 2024-01-16: qty 60

## 2024-01-16 MED ORDER — POVIDONE-IODINE 10 % EX SWAB
2.0000 | Freq: Once | CUTANEOUS | Status: AC
  Administered 2024-01-16: 2 via TOPICAL

## 2024-01-16 MED ORDER — SODIUM CHLORIDE 0.9 % IV SOLN
80.0000 mg | INTRAVENOUS | Status: AC
Start: 2024-01-16 — End: 2024-01-16
  Administered 2024-01-16: 80 mg

## 2024-01-16 MED ORDER — MIDAZOLAM HCL 5 MG/5ML IJ SOLN
INTRAMUSCULAR | Status: DC | PRN
  Administered 2024-01-16 (×2): 1 mg via INTRAVENOUS

## 2024-01-16 MED ORDER — ONDANSETRON HCL 4 MG/2ML IJ SOLN: 4.0000 mg | Freq: Four times a day (QID) | INTRAMUSCULAR | Status: DC | PRN

## 2024-01-16 MED ORDER — ACETAMINOPHEN 325 MG PO TABS: 325.0000 mg | ORAL_TABLET | ORAL | Status: DC | PRN

## 2024-01-16 MED ORDER — LIDOCAINE HCL (PF) 1 % IJ SOLN
INTRAMUSCULAR | Status: DC | PRN
  Administered 2024-01-16: 60 mL

## 2024-01-16 MED ORDER — SODIUM CHLORIDE 0.9 % IV SOLN: INTRAVENOUS | Status: DC

## 2024-01-16 MED ORDER — CHLORHEXIDINE GLUCONATE 4 % EX SOLN
4.0000 | Freq: Once | CUTANEOUS | Status: DC
  Filled 2024-01-16: qty 60

## 2024-01-16 MED ORDER — HEPARIN (PORCINE) IN NACL 1000-0.9 UT/500ML-% IV SOLN
INTRAVENOUS | Status: DC | PRN
  Administered 2024-01-16: 500 mL

## 2024-01-16 MED ORDER — CEFAZOLIN SODIUM-DEXTROSE 2-4 GM/100ML-% IV SOLN
2.0000 g | INTRAVENOUS | Status: AC
  Administered 2024-01-16: 2 g via INTRAVENOUS

## 2024-01-16 SURGICAL SUPPLY — 18 items
CABLE SURGICAL S-101-97-12 (CABLE) ×1 IMPLANT
CATH ACUITYPRO 45CM H 9F (CATHETERS) IMPLANT
CATH ATTAIN SEL SURV 6248V-90 (CATHETERS) IMPLANT
CATH GD CS-IC 130 CVD 60X7FR (CATHETERS) IMPLANT
CATH SELECT PACE 669183 (CATHETERS) IMPLANT
KIT ESSENTIALS PG (KITS) IMPLANT
LEAD ACUITY X4 4671 (Lead) IMPLANT
LEAD INGEVITY 7841 52 (Lead) IMPLANT
LEAD INGEVITY 7842 59 (Lead) IMPLANT
PACEMAKER VISIONIST X4 U228 (Pacemaker) IMPLANT
PAD DEFIB RADIO PHYSIO CONN (PAD) ×1 IMPLANT
SHEATH 7FR PRELUDE SNAP 13 (SHEATH) IMPLANT
SHEATH 8FR PRELUDE SNAP 13 (SHEATH) IMPLANT
SHEATH 9.5FR PRELUDE SNAP 13 (SHEATH) IMPLANT
SHEATH PROBE COVER 6X72 (BAG) IMPLANT
TRAY PACEMAKER INSERTION (PACKS) ×1 IMPLANT
WIRE ACUITY WHISPER EDS 4648 (WIRE) IMPLANT
WIRE HI TORQ VERSACORE-J 145CM (WIRE) IMPLANT

## 2024-01-16 NOTE — Discharge Instructions (Addendum)
 After Your Pacemaker   You have a Environmental education officer  ACTIVITY Do not lift your arm above shoulder height for 1 week after your procedure. After 7 days, you may progress as below.  You should remove your sling 24 hours after your procedure, unless otherwise instructed by your provider.     Monday January 23, 2024  Tuesday January 24, 2024 Wednesday January 25, 2024 Thursday January 26, 2024   Do not lift, push, pull, or carry anything over 10 pounds with the affected arm until 6 weeks (Monday February 27, 2024 ) after your procedure.   You may drive AFTER your wound check, unless you have been told otherwise by your provider.   Ask your healthcare provider when you can go back to work   INCISION/Dressing If you are on a blood thinner such as Coumadin, Xarelto, Eliquis, Plavix, or Pradaxa please confirm with your provider when this should be resumed. March 23  If large square, outer bandage is left in place, this can be removed after 24 hours from your procedure. Do not remove steri-strips or glue as below.   If a PRESSURE DRESSING (a bulky dressing that usually goes up over your shoulder) was applied or left in place, please follow instructions given by your provider on when to return to have this removed.   Monitor your Pacemaker site for redness, swelling, and drainage. Call the device clinic at [phone] if you experience these symptoms or fever/chills.  If your incision is sealed with Steri-strips or staples, you may shower 7 days after your procedure or when told by your provider. Do not remove the steri-strips or let the shower hit directly on your site. You may wash around your site with soap and water.    If you were discharged in a sling, please do not wear this during the day more than 48 hours after your surgery unless otherwise instructed. This may increase the risk of stiffness and soreness in your shoulder.   Avoid lotions, ointments, or perfumes over your incision  until it is well-healed.  You may use a hot tub or a pool AFTER your wound check appointment if the incision is completely closed.  Pacemaker Alerts:  Some alerts are vibratory and others beep. These are NOT emergencies. Please call our office to let us know. If this occurs at night or on weekends, it can wait until the next business day. Send a remote transmission.  If your device is capable of reading fluid status (for heart failure), you will be offered monthly monitoring to review this with you.   DEVICE MANAGEMENT Remote monitoring is used to monitor your pacemaker from home. This monitoring is scheduled every 91 days by our office. It allows [place] to keep an eye on the functioning of your device to ensure it is working properly. You will routinely see your Electrophysiologist annually (more often if necessary).   You should receive your ID card for your new device in 4-8 weeks. Keep this card with you at all times once received. Consider wearing a medical alert bracelet or necklace.  Your Pacemaker may be MRI compatible. This will be discussed at your next office visit/wound check.  You should avoid contact with strong electric or magnetic fields.   Do not use amateur (ham) radio equipment or electric (arc) welding torches. MP3 player headphones with magnets should not be used. Some devices are safe to use if held at least 12 inches (30 cm) from your Pacemaker. These include power  tools, lawn mowers, and speakers. If you are unsure if something is safe to use, ask your health care provider.  When using your cell phone, hold it to the ear that is on the opposite side from the Pacemaker. Do not leave your cell phone in a pocket over the Pacemaker.  You may safely use electric blankets, heating pads, computers, and microwave ovens.  Call the office right away if: You have chest pain. You feel more short of breath than you have felt before. You feel more light-headed than you have felt  before. Your incision starts to open up.  This information is not intended to replace advice given to you by your health care provider. Make sure you discuss any questions you have with your health care provider.

## 2024-01-16 NOTE — Interval H&P Note (Signed)
 History and Physical Interval Note:  01/16/2024 8:16 AM  Lucas Davis  has presented today for surgery, with the diagnosis of bradycardia.  The various methods of treatment have been discussed with the patient and family. After consideration of risks, benefits and other options for treatment, the patient has consented to  Procedure(s): BIV PACEMAKER INSERTION CRT-P (N/A) as a surgical intervention.  The patient's history has been reviewed, patient examined, no change in status, stable for surgery.  I have reviewed the patient's chart and labs.  Questions were answered to the patient's satisfaction.     Mehmet Scally T Arlana Canizales

## 2024-01-17 ENCOUNTER — Telehealth: Payer: Self-pay

## 2024-01-17 NOTE — Telephone Encounter (Signed)
-----   Message from Armanda Magic sent at 01/12/2024  2:42 PM EDT ----- Please let patient know that they have sleep apnea.  Recommend therapeutic CPAP titration for treatment of patient's sleep disordered breathing.

## 2024-01-17 NOTE — Telephone Encounter (Signed)
 Notified patient of sleep study results and recommendations. Patient stated he will [facility].

## 2024-01-19 ENCOUNTER — Telehealth: Payer: Self-pay

## 2024-01-19 NOTE — Telephone Encounter (Signed)
 Alert received from [employer] for Frequent PVCs noted on presenting, likely contributing to lower BIV pacing. Current BIV pacing 76%. Over 18,000 PVCs per counters. Follow up as scheduled. Routing to triage for BIV pacing of <85% not previously noted per protocol.  Routing to Dr. Lalla Brothers to advise further.

## 2024-02-01 ENCOUNTER — Ambulatory Visit

## 2024-02-02 ENCOUNTER — Ambulatory Visit: Attending: Cardiology

## 2024-02-02 DIAGNOSIS — I495 Sick sinus syndrome: Secondary | ICD-10-CM | POA: Diagnosis not present

## 2024-02-02 DIAGNOSIS — I5020 Unspecified systolic (congestive) heart failure: Secondary | ICD-10-CM | POA: Diagnosis not present

## 2024-02-02 LAB — CUP PACEART INCLINIC DEVICE CHECK
Date Time Interrogation Session: 20250403095349
Implantable Lead Connection Status: 753985
Implantable Lead Connection Status: 753985
Implantable Lead Connection Status: 753985
Implantable Lead Implant Date: 20250317
Implantable Lead Implant Date: 20250317
Implantable Lead Implant Date: 20250317
Implantable Lead Location: 753858
Implantable Lead Location: 753859
Implantable Lead Location: 753860
Implantable Lead Model: 4671
Implantable Lead Model: 7841
Implantable Lead Model: 7842
Implantable Lead Serial Number: 1433188
Implantable Lead Serial Number: 1572759
Implantable Lead Serial Number: 895838
Implantable Pulse Generator Implant Date: 20250317
Lead Channel Impedance Value: 601 Ohm
Lead Channel Impedance Value: 631 Ohm
Lead Channel Impedance Value: 947 Ohm
Lead Channel Pacing Threshold Amplitude: 0.5 V
Lead Channel Pacing Threshold Amplitude: 0.8 V
Lead Channel Pacing Threshold Amplitude: 1.1 V
Lead Channel Pacing Threshold Pulse Width: 0.4 ms
Lead Channel Pacing Threshold Pulse Width: 0.4 ms
Lead Channel Pacing Threshold Pulse Width: 1 ms
Lead Channel Sensing Intrinsic Amplitude: 14.7 mV
Lead Channel Sensing Intrinsic Amplitude: 25 mV
Lead Channel Sensing Intrinsic Amplitude: 3.9 mV
Lead Channel Setting Pacing Amplitude: 3.5 V
Lead Channel Setting Pacing Amplitude: 3.5 V
Lead Channel Setting Pacing Amplitude: 3.5 V
Lead Channel Setting Pacing Pulse Width: 0.4 ms
Lead Channel Setting Pacing Pulse Width: 1 ms
Lead Channel Setting Sensing Sensitivity: 2.5 mV
Lead Channel Setting Sensing Sensitivity: 2.5 mV
Pulse Gen Serial Number: 802299
Zone Setting Status: 755011

## 2024-02-02 NOTE — Progress Notes (Signed)
 Normal CRT pacemaker wound check. Presenting rhythm: AP/BP- 60 . Wound well healed. Routine testing performed. Thresholds, sensing, and impedances consistent with implant measurements and at 3.5V safety margin/auto capture until 3 month visit. No episodes. Reviewed arm restrictions to continue for 6 weeks total post op.  Pt enrolled in remote follow-up.

## 2024-02-02 NOTE — Patient Instructions (Addendum)

## 2024-02-03 ENCOUNTER — Encounter: Payer: Self-pay | Admitting: Cardiology
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
## 2024-02-09 ENCOUNTER — Encounter (HOSPITAL_COMMUNITY): Payer: Self-pay

## 2024-02-09 ENCOUNTER — Ambulatory Visit (HOSPITAL_COMMUNITY)
Admission: RE | Admit: 2024-02-09 | Discharge: 2024-02-09 | Disposition: A | Discharge status: Home / Self Care | Payer: No Typology Code available for payment source | Source: Ambulatory Visit | Attending: Internal Medicine | Admitting: Internal Medicine

## 2024-02-09 VITALS — BP 142/88 | HR 61 | Ht 68.0 in | Wt 173.2 lb

## 2024-02-09 DIAGNOSIS — R001 Bradycardia, unspecified: Secondary | ICD-10-CM | POA: Diagnosis not present

## 2024-02-09 DIAGNOSIS — I5022 Chronic systolic (congestive) heart failure: Secondary | ICD-10-CM | POA: Insufficient documentation

## 2024-02-09 DIAGNOSIS — I4892 Unspecified atrial flutter: Secondary | ICD-10-CM | POA: Diagnosis not present

## 2024-02-09 DIAGNOSIS — Z8616 Personal history of COVID-19: Secondary | ICD-10-CM | POA: Insufficient documentation

## 2024-02-09 DIAGNOSIS — Z7901 Long term (current) use of anticoagulants: Secondary | ICD-10-CM | POA: Diagnosis not present

## 2024-02-09 DIAGNOSIS — I110 Hypertensive heart disease with heart failure: Secondary | ICD-10-CM | POA: Diagnosis not present

## 2024-02-09 DIAGNOSIS — I493 Ventricular premature depolarization: Secondary | ICD-10-CM

## 2024-02-09 DIAGNOSIS — I2583 Coronary atherosclerosis due to lipid rich plaque: Secondary | ICD-10-CM | POA: Diagnosis not present

## 2024-02-09 DIAGNOSIS — I2510 Atherosclerotic heart disease of native coronary artery without angina pectoris: Secondary | ICD-10-CM | POA: Diagnosis not present

## 2024-02-09 DIAGNOSIS — E785 Hyperlipidemia, unspecified: Secondary | ICD-10-CM | POA: Diagnosis not present

## 2024-02-09 DIAGNOSIS — I428 Other cardiomyopathies: Secondary | ICD-10-CM | POA: Diagnosis not present

## 2024-02-09 DIAGNOSIS — Z79899 Other long term (current) drug therapy: Secondary | ICD-10-CM | POA: Diagnosis not present

## 2024-02-09 DIAGNOSIS — E119 Type 2 diabetes mellitus without complications: Secondary | ICD-10-CM | POA: Diagnosis not present

## 2024-02-09 DIAGNOSIS — M069 Rheumatoid arthritis, unspecified: Secondary | ICD-10-CM | POA: Insufficient documentation

## 2024-02-09 DIAGNOSIS — Z7984 Long term (current) use of oral hypoglycemic drugs: Secondary | ICD-10-CM | POA: Diagnosis not present

## 2024-02-09 LAB — COMPREHENSIVE METABOLIC PANEL WITH GFR
ALT: 19 U/L (ref 0–44)
AST: 25 U/L (ref 15–41)
Albumin: 4.2 g/dL (ref 3.5–5.0)
Alkaline Phosphatase: 58 U/L (ref 38–126)
Anion gap: 12 (ref 5–15)
BUN: 23 mg/dL (ref 8–23)
CO2: 23 mmol/L (ref 22–32)
Calcium: 9.4 mg/dL (ref 8.9–10.3)
Chloride: 99 mmol/L (ref 98–111)
Creatinine, Ser: 1.06 mg/dL (ref 0.61–1.24)
GFR, Estimated: >60 mL/min (ref >60)
Glucose, Bld: 96 mg/dL (ref 70–99)
Potassium: 4.6 mmol/L (ref 3.5–5.1)
Sodium: 134 mmol/L — ABNORMAL LOW (ref 135–145)
Total Bilirubin: 1 mg/dL (ref 0.0–1.2)
Total Protein: 8 g/dL (ref 6.5–8.1)

## 2024-02-09 LAB — CBC
HCT: 48.8 % (ref 39.0–52.0)
Hemoglobin: 16.4 g/dL (ref 13.0–17.0)
MCH: 30.6 pg (ref 26.0–34.0)
MCHC: 33.6 g/dL (ref 30.0–36.0)
MCV: 91 fL (ref 80.0–100.0)
Platelets: 198 10*3/uL (ref 150–400)
RBC: 5.36 MIL/uL (ref 4.22–5.81)
RDW: 15.5 % (ref 11.5–15.5)
WBC: 9.7 10*3/uL (ref 4.0–10.5)
nRBC: 0 % (ref 0.0–0.2)

## 2024-02-09 LAB — BRAIN NATRIURETIC PEPTIDE: B Natriuretic Peptide: 175 pg/mL — ABNORMAL HIGH (ref 0.0–100.0)

## 2024-02-09 LAB — TSH: TSH: 1.154 u[IU]/mL (ref 0.350–4.500)

## 2024-02-09 MED ORDER — AMIODARONE HCL 200 MG PO TABS: 200.0000 mg | ORAL_TABLET | Freq: Every day | ORAL | 8 refills | Status: AC

## 2024-02-09 MED ORDER — VALSARTAN 40 MG PO TABS: 40.0000 mg | ORAL_TABLET | Freq: Every day | ORAL | 8 refills | Status: AC

## 2024-02-09 MED ORDER — FUROSEMIDE 20 MG PO TABS: 40.0000 mg | ORAL_TABLET | Freq: Every day | ORAL | 8 refills | Status: AC

## 2024-02-09 NOTE — Patient Instructions (Addendum)
 Thank you for coming in today  If you had labs drawn today, any labs that are abnormal the clinic will call you No news is good news  Medications: Change lasix to 40 mg 1 tablet daily Decrease Amiodarone to 200 mg 1 tablet daily Increase Valsartan to 40 mg daily   Follow up appointments:  Your physician recommends that you schedule a follow-up appointment in:  2 months With Dr. Shirlee Latch  Please call our office to schedule the follow-up appointment in May 2025.    Do the following things EVERYDAY: Weigh yourself in the morning before breakfast. Write it down and keep it in a log. Take your medicines as prescribed Eat low salt foods--Limit salt (sodium) to 2000 mg per day.  Stay as active as you can everyday Limit all fluids for the day to less than 2 liters   At the [facility], you and your health needs are our priority. As part of our continuing mission to provide you with exceptional heart care, we have created designated Provider Care Teams. These Care Teams include your primary Cardiologist (physician) and Advanced Practice Providers (APPs- Physician Assistants and Nurse Practitioners) who all work together to provide you with the care you need, when you need it.   You may see any of the following providers on your designated Care Team at your next follow up: Dr Arvilla Meres Dr Marca Ancona Dr. Marcos Eke, NP Robbie Lis, [place] [facility] [place], [place] Brynda Peon, NP Karle Plumber, PharmD   Please be sure to bring in all your medications bottles to every appointment.    Thank you for choosing [facility]  If you have any questions or concerns before your next appointment please send [place] a message through [place] or call our office at [phone].    TO LEAVE A MESSAGE FOR THE NURSE SELECT OPTION 2, PLEASE LEAVE A MESSAGE INCLUDING: YOUR NAME DATE OF BIRTH CALL BACK  NUMBER REASON FOR CALL**this is important as we prioritize the call backs  YOU WILL RECEIVE A CALL BACK THE SAME DAY AS LONG AS YOU CALL BEFORE 4:00 PM

## 2024-02-20 ENCOUNTER — Ambulatory Visit (HOSPITAL_COMMUNITY)
Admission: RE | Admit: 2024-02-20 | Discharge: 2024-02-20 | Disposition: A | Discharge status: Home / Self Care | Source: Ambulatory Visit | Attending: Cardiology | Admitting: Cardiology

## 2024-02-20 DIAGNOSIS — I5022 Chronic systolic (congestive) heart failure: Secondary | ICD-10-CM | POA: Insufficient documentation

## 2024-02-20 LAB — BASIC METABOLIC PANEL WITH GFR
Anion gap: 11 (ref 5–15)
BUN: 24 mg/dL — ABNORMAL HIGH (ref 8–23)
CO2: 25 mmol/L (ref 22–32)
Calcium: 9.7 mg/dL (ref 8.9–10.3)
Chloride: 101 mmol/L (ref 98–111)
Creatinine, Ser: 1.07 mg/dL (ref 0.61–1.24)
GFR, Estimated: >60 mL/min (ref >60)
Glucose, Bld: 101 mg/dL — ABNORMAL HIGH (ref 70–99)
Potassium: 4.9 mmol/L (ref 3.5–5.1)
Sodium: 137 mmol/L (ref 135–145)

## 2024-02-21 NOTE — Progress Notes (Unsigned)
  Electrophysiology Office Note:   ID:  Lucas, Davis 1941-03-06, MRN [id number]  Primary Cardiologist: Eilleen Grates, MD Electrophysiologist: Boyce Byes, MD  {Click to update primary MD,subspecialty MD or APP then REFRESH:1}    History of Present Illness:   Lucas Davis is a 83 y.o. male with h/o Chronic systolic CHF, Bradycardia, GERD, PVCs, and HTN seen today for routine electrophysiology followup.   Since last being seen in our clinic the patient reports doing ***.  he denies chest pain, palpitations, dyspnea, PND, orthopnea, nausea, vomiting, dizziness, syncope, edema, weight gain, or early satiety.   Review of systems complete and found to be negative unless listed in HPI.   EP Information / Studies Reviewed:    EKG is ordered today. Personal review as below.       PPM Interrogation-  reviewed in detail today,  See PACEART report.  Arrhythmia/Device History BSX PPM LATITUDE   Physical Exam:   VS:  There were no vitals taken for this visit.   Wt Readings from Last 3 Encounters:  02/09/24 173 lb 3.2 oz (78.6 kg)  01/16/24 170 lb (77.1 kg)  12/28/23 172 lb 6.4 oz (78.2 kg)     GEN: No acute distress  NECK: No JVD; No carotid bruits CARDIAC: {EPRHYTHM:28826}, no murmurs, rubs, gallops RESPIRATORY:  Clear to auscultation without rales, wheezing or rhonchi  ABDOMEN: Soft, non-tender, non-distended EXTREMITIES:  {EDEMA LEVEL:28147::"No"} edema; No deformity   ASSESSMENT AND PLAN:    Chronic systolic CHF Bradycardia s/p [employer] BiV PPM  Normal PPM function See Pace Art report No changes today  Frequent PVCs Continue amiodarone  200 mg daily Surveillance labs today.   {Click here to Review PMH, Prob List, Meds, Allergies, SHx, FHx  :1}   Disposition:   Follow up with {EPPROVIDERS:28135} {EPFOLLOW UP:28173}  Signed, Tylene Galla, PA-C

## 2024-02-22 ENCOUNTER — Encounter: Payer: Self-pay | Admitting: Student

## 2024-02-22 ENCOUNTER — Ambulatory Visit: Attending: Student | Admitting: Student

## 2024-02-22 ENCOUNTER — Encounter: Admitting: Student

## 2024-02-22 VITALS — BP 136/78 | HR 60 | Ht 68.0 in | Wt 173.8 lb

## 2024-02-22 DIAGNOSIS — R001 Bradycardia, unspecified: Secondary | ICD-10-CM | POA: Diagnosis not present

## 2024-02-22 DIAGNOSIS — I5022 Chronic systolic (congestive) heart failure: Secondary | ICD-10-CM

## 2024-02-22 DIAGNOSIS — I493 Ventricular premature depolarization: Secondary | ICD-10-CM | POA: Diagnosis not present

## 2024-02-22 NOTE — Patient Instructions (Signed)
 Medication Instructions:  Your physician recommends that you continue on your current medications as directed. Please refer to the Current Medication list given to you today.  *If you need a refill on your cardiac medications before your next appointment, please call your pharmacy*  Lab Work: None ordered If you have labs (blood work) drawn today and your tests are completely normal, you will receive your results only by: MyChart Message (if you have MyChart) OR A paper copy in the mail If you have any lab test that is abnormal or we need to change your treatment, we will call you to review the results.  Follow-Up: At [facility], you and your health needs are our priority.  As part of our continuing mission to provide you with exceptional heart care, our providers are all part of one team.  This team includes your primary Cardiologist (physician) and Advanced Practice Providers or APPs (Physician Assistants and Nurse Practitioners) who all work together to provide you with the care you need, when you need it.  Your next appointment:   As scheduled    1st Floor: - Lobby - Registration  - Pharmacy  - Lab - Cafe  2nd Floor: - PV Lab - Diagnostic Testing (echo, CT, nuclear med)  3rd Floor: - Vacant  4th Floor: - TCTS (cardiothoracic surgery) - AFib Clinic - Structural Heart Clinic - Vascular Surgery  - Vascular Ultrasound  5th Floor: - HeartCare Cardiology (general and EP) - Clinical Pharmacy for coumadin, hypertension, lipid, weight-loss medications, and med management appointments    Valet parking services will be available as well.

## 2024-02-28 ENCOUNTER — Ambulatory Visit (INDEPENDENT_AMBULATORY_CARE_PROVIDER_SITE_OTHER)

## 2024-02-28 DIAGNOSIS — I495 Sick sinus syndrome: Secondary | ICD-10-CM

## 2024-02-28 LAB — CUP PACEART REMOTE DEVICE CHECK
Battery Remaining Longevity: 90 mo
Battery Remaining Percentage: 97 %
Brady Statistic RA Percent Paced: 96 %
Brady Statistic RV Percent Paced: 95 %
Date Time Interrogation Session: 20250429023000
Implantable Lead Connection Status: 753985
Implantable Lead Connection Status: 753985
Implantable Lead Connection Status: 753985
Implantable Lead Implant Date: 20250317
Implantable Lead Implant Date: 20250317
Implantable Lead Implant Date: 20250317
Implantable Lead Location: 753858
Implantable Lead Location: 753859
Implantable Lead Location: 753860
Implantable Lead Model: 4671
Implantable Lead Model: 7841
Implantable Lead Model: 7842
Implantable Lead Serial Number: 1433188
Implantable Lead Serial Number: 1572759
Implantable Lead Serial Number: 895838
Implantable Pulse Generator Implant Date: 20250317
Lead Channel Impedance Value: 1039 Ohm
Lead Channel Impedance Value: 599 Ohm
Lead Channel Impedance Value: 615 Ohm
Lead Channel Pacing Threshold Amplitude: 0.4 V
Lead Channel Pacing Threshold Amplitude: 0.7 V
Lead Channel Pacing Threshold Amplitude: 1.1 V
Lead Channel Pacing Threshold Pulse Width: 0.4 ms
Lead Channel Pacing Threshold Pulse Width: 0.4 ms
Lead Channel Pacing Threshold Pulse Width: 1 ms
Lead Channel Setting Pacing Amplitude: 3.5 V
Lead Channel Setting Pacing Amplitude: 3.5 V
Lead Channel Setting Pacing Amplitude: 3.5 V
Lead Channel Setting Pacing Pulse Width: 0.4 ms
Lead Channel Setting Pacing Pulse Width: 1 ms
Lead Channel Setting Sensing Sensitivity: 2.5 mV
Lead Channel Setting Sensing Sensitivity: 2.5 mV
Pulse Gen Serial Number: 802299
Zone Setting Status: 755011

## 2024-02-29 ENCOUNTER — Encounter: Payer: Self-pay | Admitting: Cardiology

## 2024-03-02 ENCOUNTER — Encounter: Payer: Self-pay | Admitting: Cardiology

## 2024-04-13 NOTE — Progress Notes (Signed)
 Remote pacemaker transmission.

## 2024-04-17 ENCOUNTER — Telehealth (HOSPITAL_COMMUNITY): Payer: Self-pay | Admitting: Cardiology

## 2024-04-17 NOTE — Telephone Encounter (Signed)
 pt left front office a voice message on front office telephone. front office gave pt a call back to assist - - pt states that the [facility] in [place], [place] has opened up a cardiology lab and that his care will be transitioned to them.   Pt wanted to let Dr. Mitzie Anda know that his care will be transitioning to the [facility] in [place], [place].

## 2024-04-18 ENCOUNTER — Ambulatory Visit: Admitting: Pulmonary Disease

## 2024-05-29 ENCOUNTER — Encounter

## 2024-07-12 DIAGNOSIS — E7800 Pure hypercholesterolemia, unspecified: Secondary | ICD-10-CM | POA: Diagnosis not present

## 2024-07-12 DIAGNOSIS — R7303 Prediabetes: Secondary | ICD-10-CM | POA: Diagnosis not present

## 2024-07-19 DIAGNOSIS — I10 Essential (primary) hypertension: Secondary | ICD-10-CM | POA: Diagnosis not present

## 2024-07-19 DIAGNOSIS — E7800 Pure hypercholesterolemia, unspecified: Secondary | ICD-10-CM | POA: Diagnosis not present

## 2024-07-19 DIAGNOSIS — L4050 Arthropathic psoriasis, unspecified: Secondary | ICD-10-CM | POA: Diagnosis not present

## 2024-07-19 DIAGNOSIS — I5022 Chronic systolic (congestive) heart failure: Secondary | ICD-10-CM | POA: Diagnosis not present

## 2024-07-19 DIAGNOSIS — E119 Type 2 diabetes mellitus without complications: Secondary | ICD-10-CM | POA: Diagnosis not present

## 2024-07-19 DIAGNOSIS — R001 Bradycardia, unspecified: Secondary | ICD-10-CM | POA: Diagnosis not present

## 2024-07-19 DIAGNOSIS — G250 Essential tremor: Secondary | ICD-10-CM | POA: Diagnosis not present

## 2024-07-19 DIAGNOSIS — K219 Gastro-esophageal reflux disease without esophagitis: Secondary | ICD-10-CM | POA: Diagnosis not present

## 2024-07-19 DIAGNOSIS — C61 Malignant neoplasm of prostate: Secondary | ICD-10-CM | POA: Diagnosis not present

## 2024-08-22 ENCOUNTER — Telehealth (HOSPITAL_COMMUNITY): Payer: Self-pay | Admitting: Cardiology

## 2024-08-28 ENCOUNTER — Encounter

## 2024-11-27 ENCOUNTER — Encounter

## 2025-02-26 ENCOUNTER — Encounter

## 2025-05-28 ENCOUNTER — Encounter

## 2025-08-27 ENCOUNTER — Encounter

## 2025-11-26 ENCOUNTER — Encounter
